# Patient Record
Sex: Male | Born: 1935 | Race: White | Hispanic: No | Marital: Married | Smoking: Former smoker
Health system: Southern US, Community
[De-identification: ages and names within clinical notes are randomized; demographics above are authoritative.]

## PROBLEM LIST (undated history)

## (undated) DIAGNOSIS — I2510 Atherosclerotic heart disease of native coronary artery without angina pectoris: Secondary | ICD-10-CM

## (undated) DIAGNOSIS — Z86718 Personal history of other venous thrombosis and embolism: Secondary | ICD-10-CM

## (undated) DIAGNOSIS — Z951 Presence of aortocoronary bypass graft: Secondary | ICD-10-CM

## (undated) DIAGNOSIS — C189 Malignant neoplasm of colon, unspecified: Secondary | ICD-10-CM

## (undated) DIAGNOSIS — Z9289 Personal history of other medical treatment: Secondary | ICD-10-CM

## (undated) DIAGNOSIS — Z86711 Personal history of pulmonary embolism: Secondary | ICD-10-CM

## (undated) DIAGNOSIS — E785 Hyperlipidemia, unspecified: Secondary | ICD-10-CM

## (undated) DIAGNOSIS — I4510 Unspecified right bundle-branch block: Secondary | ICD-10-CM

## (undated) DIAGNOSIS — I10 Essential (primary) hypertension: Secondary | ICD-10-CM

## (undated) HISTORY — DX: Personal history of pulmonary embolism: Z86.711

## (undated) HISTORY — DX: Unspecified right bundle-branch block: I45.10

## (undated) HISTORY — DX: Hyperlipidemia, unspecified: E78.5

## (undated) HISTORY — PX: COLONOSCOPY: SHX174

## (undated) HISTORY — DX: Presence of aortocoronary bypass graft: Z95.1

## (undated) HISTORY — PX: CARDIAC CATHETERIZATION: SHX172

## (undated) HISTORY — DX: Atherosclerotic heart disease of native coronary artery without angina pectoris: I25.10

## (undated) HISTORY — DX: Malignant neoplasm of colon, unspecified: C18.9

## (undated) HISTORY — DX: Personal history of other venous thrombosis and embolism: Z86.718

## (undated) HISTORY — DX: Personal history of other medical treatment: Z92.89

## (undated) HISTORY — DX: Essential (primary) hypertension: I10

---
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
## 1976-01-22 HISTORY — PX: ANKLE SURGERY: SHX546

## 1995-01-22 DIAGNOSIS — C189 Malignant neoplasm of colon, unspecified: Secondary | ICD-10-CM

## 1995-01-22 HISTORY — DX: Malignant neoplasm of colon, unspecified: C18.9

## 1997-03-21 ENCOUNTER — Ambulatory Visit (HOSPITAL_COMMUNITY): Admission: RE | Admit: 1997-03-21 | Discharge: 1997-03-21 | Payer: Self-pay | Admitting: *Deleted

## 1997-04-21 ENCOUNTER — Encounter: Admission: RE | Admit: 1997-04-21 | Discharge: 1997-07-20 | Payer: Self-pay | Admitting: *Deleted

## 1999-03-29 ENCOUNTER — Ambulatory Visit (HOSPITAL_COMMUNITY): Admission: RE | Admit: 1999-03-29 | Discharge: 1999-03-29 | Payer: Self-pay | Admitting: Gastroenterology

## 2001-01-21 HISTORY — PX: CHOLECYSTECTOMY: SHX55

## 2001-06-12 ENCOUNTER — Ambulatory Visit (HOSPITAL_COMMUNITY): Admission: RE | Admit: 2001-06-12 | Discharge: 2001-06-12 | Payer: Self-pay | Admitting: Gastroenterology

## 2001-06-12 ENCOUNTER — Encounter (INDEPENDENT_AMBULATORY_CARE_PROVIDER_SITE_OTHER): Payer: Self-pay | Admitting: *Deleted

## 2001-11-12 ENCOUNTER — Encounter: Payer: Self-pay | Admitting: Internal Medicine

## 2001-11-12 ENCOUNTER — Inpatient Hospital Stay (HOSPITAL_COMMUNITY): Admission: EM | Admit: 2001-11-12 | Discharge: 2001-11-22 | Payer: Self-pay | Admitting: Emergency Medicine

## 2001-11-13 ENCOUNTER — Encounter: Payer: Self-pay | Admitting: Internal Medicine

## 2001-11-17 ENCOUNTER — Encounter: Payer: Self-pay | Admitting: Internal Medicine

## 2001-11-18 ENCOUNTER — Encounter (INDEPENDENT_AMBULATORY_CARE_PROVIDER_SITE_OTHER): Payer: Self-pay | Admitting: Specialist

## 2001-12-22 ENCOUNTER — Encounter: Payer: Self-pay | Admitting: General Surgery

## 2001-12-22 ENCOUNTER — Encounter: Admission: RE | Admit: 2001-12-22 | Discharge: 2001-12-22 | Payer: Self-pay | Admitting: General Surgery

## 2001-12-27 ENCOUNTER — Inpatient Hospital Stay (HOSPITAL_COMMUNITY): Admission: EM | Admit: 2001-12-27 | Discharge: 2002-01-02 | Payer: Self-pay | Admitting: Emergency Medicine

## 2001-12-27 ENCOUNTER — Encounter: Payer: Self-pay | Admitting: Gastroenterology

## 2001-12-27 ENCOUNTER — Encounter: Payer: Self-pay | Admitting: Emergency Medicine

## 2001-12-30 ENCOUNTER — Encounter: Payer: Self-pay | Admitting: Gastroenterology

## 2002-11-23 ENCOUNTER — Ambulatory Visit (HOSPITAL_COMMUNITY): Admission: RE | Admit: 2002-11-23 | Discharge: 2002-11-23 | Payer: Self-pay | Admitting: Ophthalmology

## 2002-11-29 ENCOUNTER — Ambulatory Visit (HOSPITAL_COMMUNITY): Admission: RE | Admit: 2002-11-29 | Discharge: 2002-11-29 | Payer: Self-pay | Admitting: Ophthalmology

## 2003-04-08 ENCOUNTER — Emergency Department (HOSPITAL_COMMUNITY): Admission: EM | Admit: 2003-04-08 | Discharge: 2003-04-09 | Payer: Self-pay | Admitting: Emergency Medicine

## 2004-01-22 HISTORY — PX: HERNIA REPAIR: SHX51

## 2004-02-21 ENCOUNTER — Ambulatory Visit (HOSPITAL_COMMUNITY): Admission: RE | Admit: 2004-02-21 | Discharge: 2004-02-21 | Payer: Self-pay | Admitting: General Surgery

## 2004-02-21 ENCOUNTER — Encounter (INDEPENDENT_AMBULATORY_CARE_PROVIDER_SITE_OTHER): Payer: Self-pay | Admitting: *Deleted

## 2006-01-21 DIAGNOSIS — Z951 Presence of aortocoronary bypass graft: Secondary | ICD-10-CM

## 2006-01-21 HISTORY — DX: Presence of aortocoronary bypass graft: Z95.1

## 2006-02-06 ENCOUNTER — Inpatient Hospital Stay (HOSPITAL_COMMUNITY): Admission: RE | Admit: 2006-02-06 | Discharge: 2006-02-11 | Payer: Self-pay | Admitting: Cardiothoracic Surgery

## 2006-02-06 HISTORY — PX: CORONARY ARTERY BYPASS GRAFT: SHX141

## 2006-02-25 ENCOUNTER — Encounter: Admission: RE | Admit: 2006-02-25 | Discharge: 2006-02-25 | Payer: Self-pay | Admitting: Cardiothoracic Surgery

## 2006-02-28 ENCOUNTER — Ambulatory Visit: Payer: Self-pay | Admitting: Cardiothoracic Surgery

## 2006-10-19 ENCOUNTER — Inpatient Hospital Stay (HOSPITAL_COMMUNITY): Admission: EM | Admit: 2006-10-19 | Discharge: 2006-10-27 | Payer: Self-pay | Admitting: Emergency Medicine

## 2006-10-21 ENCOUNTER — Ambulatory Visit: Payer: Self-pay | Admitting: Surgery

## 2008-09-21 DIAGNOSIS — Z9289 Personal history of other medical treatment: Secondary | ICD-10-CM

## 2008-09-21 HISTORY — DX: Personal history of other medical treatment: Z92.89

## 2008-09-29 IMAGING — CR DG CHEST 1V PORT
1 series · 1 of 1 positions shown · non-contrast
Comparison: 02/06/06.

CLINICAL DATA: 80-year-old with coronary artery disease.
 PORTABLE CHEST - 1 VIEW:

[view not recorded]
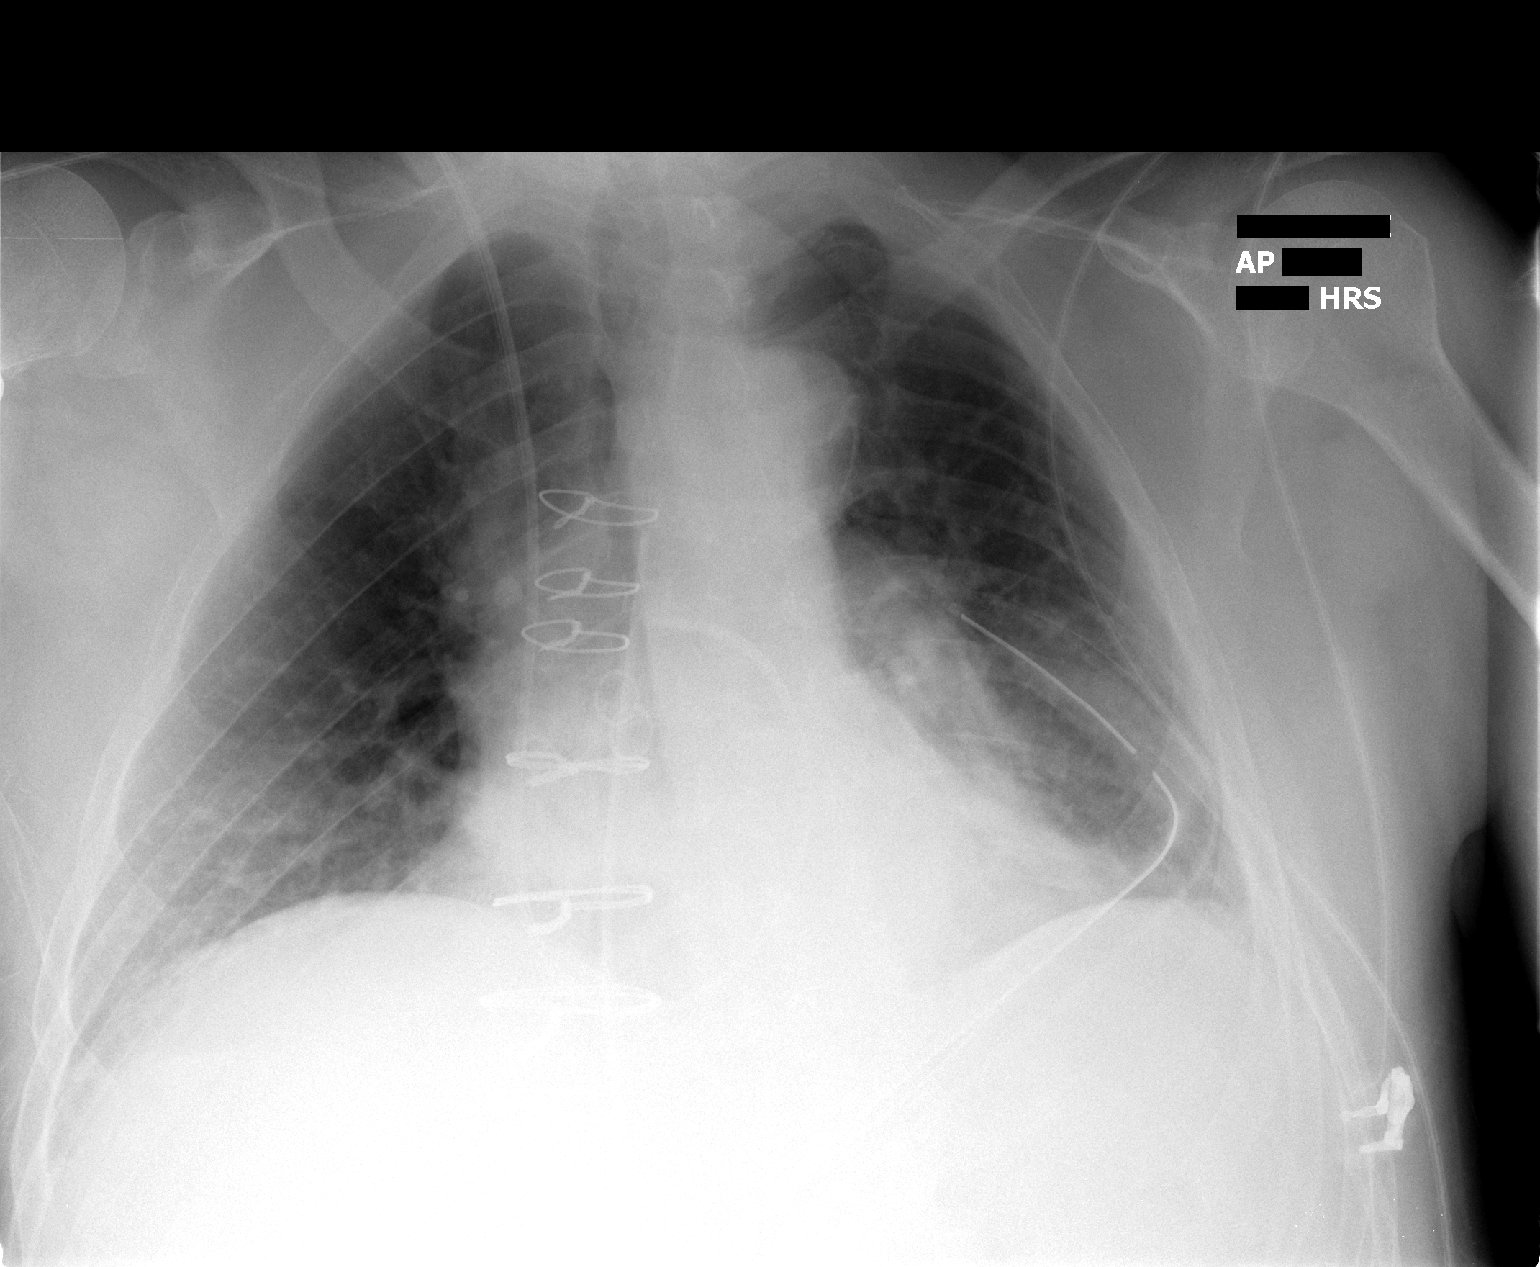

[1 of 1 positions shown; findings below may reference images not displayed]

FINDINGS: Portable semiupright view of the chest demonstrates removal of the nasogastric tube and endotracheal tube. One of the mediastinal drains appears to have been removed. The patient continues to have left chest tubes and a mediastinal drain. Pulmonary artery catheter is in the main right pulmonary artery. Decreased lung volumes with some atelectasis. The heart and mediastinum are stable status-post median sternotomy. No evidence for pneumothorax.
IMPRESSION: 1.  Removal of support apparatus as described.  No pneumothorax. 
 2.  Postoperative changes with low lung volumes.

## 2009-07-13 ENCOUNTER — Inpatient Hospital Stay (HOSPITAL_COMMUNITY): Admission: EM | Admit: 2009-07-13 | Discharge: 2009-07-17 | Payer: Self-pay | Admitting: Emergency Medicine

## 2010-02-10 ENCOUNTER — Encounter: Payer: Self-pay | Admitting: Family Medicine

## 2010-04-08 LAB — BASIC METABOLIC PANEL
BUN: 13 mg/dL (ref 6–23)
BUN: 22 mg/dL (ref 6–23)
CO2: 25 mEq/L (ref 19–32)
Calcium: 7.9 mg/dL — ABNORMAL LOW (ref 8.4–10.5)
Chloride: 106 mEq/L (ref 96–112)
Chloride: 109 mEq/L (ref 96–112)
Creatinine, Ser: 0.92 mg/dL (ref 0.4–1.5)
Creatinine, Ser: 1 mg/dL (ref 0.4–1.5)
GFR calc Af Amer: >60 mL/min (ref >60)
GFR calc Af Amer: >60 mL/min (ref >60)
GFR calc non Af Amer: >60 mL/min (ref >60)
GFR calc non Af Amer: >60 mL/min (ref >60)
Potassium: 3.5 mEq/L (ref 3.5–5.1)
Potassium: 3.8 mEq/L (ref 3.5–5.1)
Potassium: 3.9 mEq/L (ref 3.5–5.1)
Sodium: 139 mEq/L (ref 135–145)
Sodium: 139 mEq/L (ref 135–145)

## 2010-04-08 LAB — CBC
HCT: 34 % — ABNORMAL LOW (ref 39.0–52.0)
HCT: 37.6 % — ABNORMAL LOW (ref 39.0–52.0)
HCT: 40.8 % (ref 39.0–52.0)
Hemoglobin: 13.7 g/dL (ref 13.0–17.0)
MCH: 34.5 pg — ABNORMAL HIGH (ref 26.0–34.0)
MCV: 103 fL — ABNORMAL HIGH (ref 78.0–100.0)
MCV: 103.5 fL — ABNORMAL HIGH (ref 78.0–100.0)
Platelets: 225 10*3/uL (ref 150–400)
Platelets: 229 10*3/uL (ref 150–400)
Platelets: 232 10*3/uL (ref 150–400)
RBC: 3.63 MIL/uL — ABNORMAL LOW (ref 4.22–5.81)
RBC: 3.96 MIL/uL — ABNORMAL LOW (ref 4.22–5.81)
RDW: 14.3 % (ref 11.5–15.5)
RDW: 14.3 % (ref 11.5–15.5)
WBC: 11.3 10*3/uL — ABNORMAL HIGH (ref 4.0–10.5)
WBC: 11.9 10*3/uL — ABNORMAL HIGH (ref 4.0–10.5)
WBC: 8.6 10*3/uL (ref 4.0–10.5)

## 2010-04-08 LAB — HEPARIN LEVEL (UNFRACTIONATED)
Heparin Unfractionated: 0.18 IU/mL — ABNORMAL LOW (ref 0.30–0.70)
Heparin Unfractionated: 0.28 IU/mL — ABNORMAL LOW (ref 0.30–0.70)
Heparin Unfractionated: 0.65 IU/mL (ref 0.30–0.70)
Heparin Unfractionated: 0.78 IU/mL — ABNORMAL HIGH (ref 0.30–0.70)

## 2010-04-08 LAB — PROTIME-INR
INR: 1.98 — ABNORMAL HIGH (ref 0.00–1.49)
INR: 2.19 — ABNORMAL HIGH (ref 0.00–1.49)
INR: 2.35 — ABNORMAL HIGH (ref 0.00–1.49)
Prothrombin Time: 22.3 seconds — ABNORMAL HIGH (ref 11.6–15.2)
Prothrombin Time: 25.5 seconds — ABNORMAL HIGH (ref 11.6–15.2)

## 2010-04-08 LAB — DIFFERENTIAL
Basophils Absolute: 0.3 10*3/uL — ABNORMAL HIGH (ref 0.0–0.1)
Eosinophils Absolute: 0 10*3/uL (ref 0.0–0.7)
Eosinophils Relative: 0 % (ref 0–5)
Lymphocytes Relative: 16 % (ref 12–46)
Lymphs Abs: 2.2 10*3/uL (ref 0.7–4.0)
Monocytes Absolute: 0.8 10*3/uL (ref 0.1–1.0)
Monocytes Relative: 6 % (ref 3–12)
Neutrophils Relative %: 76 % (ref 43–77)

## 2010-04-08 LAB — COMPREHENSIVE METABOLIC PANEL
ALT: 19 U/L (ref 0–53)
Albumin: 4 g/dL (ref 3.5–5.2)
Alkaline Phosphatase: 55 U/L (ref 39–117)
BUN: 20 mg/dL (ref 6–23)
Total Bilirubin: 1.1 mg/dL (ref 0.3–1.2)

## 2010-04-08 LAB — URINALYSIS, ROUTINE W REFLEX MICROSCOPIC
Ketones, ur: NEGATIVE mg/dL
Protein, ur: NEGATIVE mg/dL
Specific Gravity, Urine: 1.024 (ref 1.005–1.030)

## 2010-04-08 LAB — POCT I-STAT, CHEM 8
Calcium, Ion: 1.19 mmol/L (ref 1.12–1.32)
Creatinine, Ser: 1.3 mg/dL (ref 0.4–1.5)
Glucose, Bld: 132 mg/dL — ABNORMAL HIGH (ref 70–99)
Sodium: 139 mEq/L (ref 135–145)
TCO2: 26 mmol/L (ref 0–100)

## 2010-04-08 LAB — LIPASE, BLOOD: Lipase: 33 U/L (ref 11–59)

## 2010-06-05 NOTE — Cardiovascular Report (Signed)
NAMEDABID, GODOWN NO.:  [id number]   MEDICAL RECORD NO.:  [id number]          PATIENT TYPE:  INP   LOCATION:  2041                         FACILITY:  [facility]   PHYSICIAN:  Nicki Guadalajara, M.D.     DATE OF BIRTH:  07/16/1925   DATE OF PROCEDURE:  10/20/2006  DATE OF DISCHARGE:                            CARDIAC CATHETERIZATION   INDICATIONS:  Mr. Richard Mendez is a 77 year old gentleman, who in December  2007 was found to have significant anterior ischemia on nuclear stress  testing, which led to cardiac catheterization.  Catheterization showed  severe coronary calcification with 95% distal left main stenosis, 60% to  70% diffuse proximal LAD stenosis prior to total occlusion after septal  perforating artery and a moderate-sized diagonal vessel.  The circumflex  had 60% and 50% stenoses.  The right coronary was diffusely calcified  with 60% narrowing in the mid-segment, 70% in the distal RCA after the  PDA, and prior to the PLA, and there was extensive collateralization to  the entire LAD system, up to the point of proximal occlusion, from the  PDA vessel, to the LAD.  On February 06, 2006, he underwent bypass  surgery with Dr. Donata Clay, with a LIMA to the LAD, vein to diagonal,  vein to the OM-2, and vein to the PDA.  He does have mixed  hyperlipidemia.  He had done well, but over the past month had noticed a  change in symptomatology, with exertional shortness of breath.  A  percent Myoview study done last week showed now showed entirely normal  perfusion in the LAD territory, following revascularization, but there  was the possibility of nontransmural inferior scar/mild ischemia in the  mid inferior to apical inferior lateral segment.  The patient was  scheduled for an outpatient cardiac catheterization today but apparently  over the weekend developed increasing episodes of exertional shortness  of breath, leading to his [facility].  He is now  referred for definitive diagnostic cardiac catheterization.   PROCEDURE:  After pre-medication with Valium 5 mg intravenous, the  patient was prepped and draped in the usual fashion.  His right femoral  artery was punctured anteriorly, and a 5-French sheath was inserted  without difficulty.  Diagnostic catheterization done utilizing 5-French  Judkins 4 left and right coronary catheters.  A right guide catheter was  used for selective angiography into the vein graft supplying the OM1 and  OM2 vessel.  A left bypass catheter was used for selective angiography  into the diagonal graft.  A right bypass catheter was used for selective  angiography into the vein graft supplying the PDA.  A LIMA catheter was  used for selective angiography into the left internal mammary artery.  Pigtail catheter was used for biplane cine left ventriculography.  Hemostasis was attained by direct manual pressure.  The patient  tolerated the procedure well.   HEMODYNAMIC DATA:  Central aortic pressure 100/57.  Left ventricular  pressure 100/9.   ANGIOGRAPHIC DATA:  There was extensive coronary calcification of the  left main LAD, circumflex and right coronary artery  system.   The left main coronary artery was now totally occluded, in its mid-  segment.  There was no antegrade flow to either the native LAD or  circumflex vessel.   The vein graft supplying the OM-2 vessel was widely patent.  There was  filling of the left circumflex, all the way up to it's point of total  proximal occlusion.  There is mild 40% circumflex narrowing, after a  small marginal branch.   The vein graft supplying the diagonal vessel was widely patent.  There  was filling of the LAD, proximally up to the point of the left main  occlusion.  As noted previously, the LAD was occluded, in a short  segment just after the diagonal takeoff.   The LIMA graft was widely patent and anastomosed into the mid-distal  LAD.  The distal LAD was  small caliber and reached the LV apex.   The native right coronary artery was diffusely calcified and had  proximal 30% narrowing, mid 70% to 80% narrowing, followed by diffuse  50% narrowing, before and after the crux.  In the PLA, there was 80%  stenosis, beyond the PDA takeoff.   The vein graft supplying the PDA was widely patent.  There was no distal  PDA disease beyond the graft.  There was good visualization up the  antegrade to the RCA, with filling of the PLA, but again as noted on the  native injection there was 80% PLA stenosis.  A sequential graft had not  been placed to the PD PLA system.  The PLA was small caliber and at most  was 2-0 vessel.   Biplane cine left ventriculography revealed preserved global  contractility, without focal segmental wall motion abnormality.  Ejection fraction at least 55%.   IMPRESSION:  1. Severe native coronary artery disease with severe diffuse coronary      calcification and total occlusion of the left main, as well as      diffuse native RCA disease with 30% proximal, 70% to 80% mid,      diffuse 50% stenoses before and after the crux, and 80% distal RCA      stenosis involving the PLA vessel.  2. Patent vein graft supplying the PDA branch of the right coronary      artery.  3. Patent vein graft supplying the OM-2 branch of the circumflex      vessel.  4. Patent vein graft supplying the diagonal vessel.  5. Patent LIMA graft supplying the mid-distal LAD.   RECOMMENDATIONS:  Medical therapy.  Mr. Richard Mendez recent nuclear studies  demonstrates now normal perfusion to the LAD territory, which is  markedly improved from his December 2007stress study.  He does show  slight progressive scar/ischemia in the distal apical inferior  inferolateral wall, most likely due to his of PLA lesion.  Increased  medical therapy will be recommended.           ______________________________  Nicki Guadalajara, M.D.     TK/MEDQ  D:  10/20/2006  T:   10/20/2006  Job:  [id number]   cc:   Nicki Guadalajara, M.D.  Kerin Perna, M.D.  Marjory Lies, M.D.

## 2010-06-05 NOTE — Discharge Summary (Signed)
Richard Mendez, Richard Mendez                  ACCOUNT NO.:  [id number]   MEDICAL RECORD NO.:  [id number]          PATIENT TYPE:  INP   LOCATION:  2041                         FACILITY:  [facility]   PHYSICIAN:  Cristy Hilts. Jacinto Halim, MD       DATE OF BIRTH:  1925-07-27   DATE OF ADMISSION:  10/19/2006  DATE OF DISCHARGE:  10/27/2006                               DISCHARGE SUMMARY   DISCHARGING PHYSICIAN:  Dr. Jacinto Halim.   DISCHARGE DIAGNOSES:  1. Pulmonary embolism with right lower extremity deep venous      thrombosis.  2. Highlighted sensitivity to INR - the patient is being discharged      with supratherapeutic INR and plan to monitor it as an outpatient      by daily PT/INR and adjusting the dose of Coumadin appropriately.  3. Known coronary artery disease with history of coronary artery      bypass graft, status post cath this admission with patent grafts.  4. Hypertension - medications adjusted.  5. Dyslipidemia - treated.  6. Status post colectomy for colon cancer.  7. 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  8. Status post cholecystectomy with history of postoperative      pancreatitis.   This is a 77 year old gentleman patient of Dr. Tresa Endo with previous  history of coronary artery disease status post a CABG, who presented to  the emergency room with worsening dyspnea and diaphoresis and dizziness.  He denied any chest pain, but complained of episode of presyncope,  probably 2 or 3 days prior to his presentation.  He was seen by Dr.  Tresa Endo on September 25th, and scheduled for the cath on September 29th to  address the progression of coronary disease with given symptoms; but  because of worsening of his clinical status, the patient was presented  to the emergency room.  Here on admission, we cycled his enzymes and  there were negative.  We also checked his D-dimer that revealed positive  result, 2.61.  In followup, the patient underwent nuclear pulmonary  perfusion  test that showed high probability of bilateral pulmonary  emboli.   He underwent catheterization on October 20, 2006 that showed patent  graft and severe native coronary disease.   During this admission, the patient had a syncopal episode which could be  secondary to pulmonary embolism versus orthostatic blood pressure drop.  He was up and going to the bathroom, and was found lying on the bathroom  floor.  For a short period of time he was unresponsive and his blood  pressure was checked and it was 88 systolic.  Diastolic blood pressure  was not measurable, but a few minutes later it was possible to assess  blood pressure which was 102/60.   Rapid response was initiated.  The patient quickly came around and upon  questioning  about this sensation and about this accident, the patient  said that he was having a bowel movement and the next thing he knows he  woke up on the floor.   After adjusting his medications,  his blood pressure still remained  borderline hypertensive.  The patient was started on Coumadin.  His  lower extremity ultrasound revealed right DVT in the posterior tibial  vein in the mid calf.  Left lower extremity did not have any evidence of  DVT or superficial thrombolysis.   We started the patient on Coumadin, but he was overly-sensitive to the  medication, and his INR increased from 1.3 on September 30th to 2.7 on  October 1st, and 5.6 on October 2.  The next blood work was done on the  same day, October 2nd, in the afternoon, and it revealed 5.5.  INR on  October 3rd was 9.4, on October 5th 4.7, and on day of discharge 4.8.  Coumadin was placed on hold since October 1st.   His other blood work showed elevated homocysteine level 22.7.  He had  BNP that showed sodium 139, potassium 4.8, chloride 107, CO2 25, glucose  108, BUN 26, creatinine 171.  He also had CBC showing white blood cells  8.3, hemoglobin 11.9, hematocrit 35.7, platelet count 403.  Liver  function  tests were normal.  PSA was 1.53.  Carcinogen antigen was 0.7,  and as I mentioned, all his cardiac enzymes negative x3.   DISCHARGE MEDICATIONS:  1. Tricor 145 mg  daily.  2. Altace 2.5 mg daily.  3. Aspirin 81 mg daily.  4. Toprol XL 25 mg daily.  5. Crestor 5 mg daily.  6. ICaps b.i.d.  7. Coumadin will be started as outpatient and the dose will be      adjusted, depending on INR value.   DISCHARGE DIET:  Low-salt, low-fat, low-cholesterol diet.  The patient  was instructed to increase activity slowly.   DISCHARGE FOLLOWUP:  The office will call him to schedule appointment  for Coumadin, and he would need his appointment for pro time and INR  checkup, and he will need to have it done daily for 7-10 days, and Dr.  Tresa Endo will see the patient in followup in a couple of weeks.      Raymon Mutton, P.A.      Cristy Hilts. Jacinto Halim, MD  Electronically Signed    MK/MEDQ  D:  10/27/2006  T:  10/27/2006  Job:  [id number]

## 2010-06-08 NOTE — Op Note (Signed)
NAME:  Richard Mendez, Richard Mendez                            ACCOUNT NO.:  [id number]   MEDICAL RECORD NO.:  [id number]                   PATIENT TYPE:  INP   LOCATION:  3036                                 FACILITY:  [facility]   PHYSICIAN:  Ollen Gross. Vernell Morgans, M.D.              DATE OF BIRTH:  May 31, 1925   DATE OF PROCEDURE:  11/25/2001  DATE OF DISCHARGE:  11/22/2001                                 OPERATIVE REPORT   PREOPERATIVE DIAGNOSES:  Cholecystitis.   POSTOPERATIVE DIAGNOSES:  Cholecystitis.   OPERATION PERFORMED:  Attempted laparoscopic and subsequent open  cholecystectomy   SURGEON:  Ollen Gross. Carolynne Edouard, M.D.   ASSISTANT:  1. [place]. Daphine Deutscher, M.D.  2. Sandria Bales. Ezzard Standing, M.D.   ANESTHESIA:  General endotracheal.   DESCRIPTION OF PROCEDURE:  After informed consent was obtained, the patient  was brought to the operating room and placed in supine position on the  operating table.  After adequate induction of general anesthesia, the  patient's abdomen was prepped with Betadine and draped in the usual sterile  manner.  The area around the umbilicus was infiltrated with 0.25% Marcaine  and a small incision was made with a 15 blade knife.  This incision was  carried down bluntly through the subcutaneous tissue using Kelly clamps and  army-navy retractors until the linea alba was identified.  The linea alba  was incised with a 15 blade knife and each side was grasped with Kocher  clamps and elevated anteriorly.  The preperitoneal space was probed bluntly  with a hemostat.  The patient had prior midline abdominal surgery before and  it appeared as though we could identify some omentum but there was no  obvious free space that could be identified.  At this point the laparoscopic  portion of the procedure was aborted.  The fascial defect was closed with a  Vicryl pursestring stitch.  Attention was then turned to the right upper  quadrant.  A subcostal incision was made with a 10 blade knife.  This  incision was carried down through the skin and subcutaneous tissues using  sharp dissection with the electrocautery until the fascia of the anterior  abdominal wall was encountered.  The anterior rectus fascia and external  oblique fascia were opened with the electrocautery.  The muscle was divided  sharply with the electrocautery and the posterior fascia was also divided  with the electrocautery.  The peritoneum was identified and opened sharply  with the Metzenbaum scissors.  The rest of the incision was then opened  under direct vision.  There was obvious adhesions of the omentum to the  anterior abdominal wall which were taken down in the area of the incision,  sharply with Metzenbaum scissors and the Bovie electrocautery.  There was an  obvious inflammatory reaction and phlegmon in the right upper quadrant.  This was mostly able to be finger fractured off of  the gallbladder without  any difficulty.  The gallbladder had some areas of gangrene.  The dome of  the gallbladder was grasped with a Kelly clamp.  Moist sponges and Deaver  retractors and sweetheart retractors were used to retract everything away  from the gallbladder.  The gallbladder was taken off the liver bed sharply.  At this point using the Bovie electrocautery and with some blunt dissection  with a tonsil clamp, the gallbladder seemed to be fairly friable and the  back wall of the gallbladder had to be left on the liver.  The cystic artery  was identified and controlled with vessel clips.  The gallbladder neck area  where it narrowed down to connect with the cystic duct could be palpated but  could not be dissected very well.  The gallbladder was divided near its base  at the gallbladder neck and removed from the patient.  The area thought to  be the gallbladder neck cystic duct junction was then ligated with 2-0  Vicryl stitch.  No actual patent cystic duct could be identified by probing.  There was too much inflammatory  reaction in this area to dissect it out any  further without significantly risking damaging the common duct.  The mucosa  from the back wall of the gallbladder was then fulgurated with the  electrocautery.  A 19 French round Blake drain was then brought into the  abdomen through a lateral puncture site lateral to the incision and brought  to rest in the bed of the liver where the gallbladder had been.  The wound  was irrigated with copious amounts of saline.  The stones that had spilled  from the gallbladder were all removed.  The posterior fascia of the  abdominal wall was then closed with a running #1 PDS suture.  The wound was  then irrigated and the anterior fascial layer was also closed with a running  #1 PDS suture.  The subcutaneous tissue was again irrigated.  The skin was  closed with staples.  The patient tolerated the procedure well.  At the end  of the case all sponge, needle and instrument counts were correct.  The  drain was anchored in place with a 3-0 nylon stitch and placed to bulb  suction.  The patient was awakened and taken to the recovery room in stable  condition.                                                   Ollen Gross. Vernell Morgans, M.D.    PST/MEDQ  D:  11/25/2001  T:  11/25/2001  Job:  [id number]

## 2010-06-08 NOTE — H&P (Signed)
NAME:  Richard Mendez, Richard Mendez                            ACCOUNT NO.:  [id number]   MEDICAL RECORD NO.:  [id number]                   PATIENT TYPE:  INP   LOCATION:  0477                                 FACILITY[facility]   PHYSICIAN:  Bernette Redbird, M.D.                DATE OF BIRTH:  29-Apr-1925   DATE OF ADMISSION:  12/26/2001  DATE OF DISCHARGE:                                HISTORY & PHYSICAL   HISTORY OF PRESENT ILLNESS:  This 77 year old male is being admitted to the  hospital because of acute pancreatitis.   The patient is approximately one month status post an open cholecystectomy  by Dr. Carolynne Edouard, at which time a fair amount of scar tissue was encountered as  well as gallbladder gangrene and inability to accomplish an intraoperative  cholangiogram for those reasons.  The patient had a JP drain in for a couple  of weeks with somewhat prolonged drainage, but it was finally pulled a  couple of weeks ago.   With that background, the patient had been doing reasonably well  postoperatively, apart from relative lethargy and inactivity, until this  afternoon around 12 noon when he developed severe epigastric pain radiating  through to the back which occurred after eating pizza for lunch.  He  presented to the emergency room where he was found to have elevation of both  lipase and liver chemistries.  He has received pain medication in the  emergency room and is feeling substantially better at this time.   ALLERGIES:  MORPHINE (rash).   OUTPATIENT MEDICATIONS:  Zocor (just started two days ago) and Flomax.   OPERATIONS:  1. Remote hip replacement.  2. Partial colectomy for colon cancer about six or seven years ago.  3. Remote splenectomy for ITP.  4. Open cholecystectomy about a month ago.   MEDICAL ILLNESSES:  1. Colon cancer (most recent colonoscopy by Dr. Randa Evens in May of this year     showed a polyp).  2. Previous history of ITP.  3. Hypercholesterolemia.  4. There is no known  coronary disease, COPD, diabetes, or hypertension.   HABITS:  Stopped smoking many years ago.  Moderate ethanol (a couple of  drinks a night) until his gallbladder surgery a month ago, and none since.   FAMILY HISTORY:  Possible colon cancer in a sister.  Also, gallstones in two  sisters.   SOCIAL HISTORY:  Married and is accompanied tonight by his wife and his  daughter, Idell Pickles, who are at the bedside.  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   REVIEW OF SYSTEMS:  The patient had a heavy sweat and a shaking chill this  evening but has not been running fevers as far as he is aware.  He has been  remarkably free of nausea, and there has been no vomiting.  He has had some  recent urinary incontinence  for which the Flomax was ordered.   PHYSICAL EXAMINATION:  VITAL SIGNS:  Temperature not recorded.  Blood  pressure 113/74, with pulse of 83, respirations 18.  GENERAL:  This is a well-nourished Caucasian gentleman in no acute distress,  having received Dilaudid with good relief in the emergency department.  He  is slightly somnolent but arouses very easily.  HEENT:  No definite scleral icterus.  No significant pallor.  Oropharynx  benign.  No cervical masses appreciated.  No obvious cervical adenopathy or  thyromegaly.  CHEST:  Slightly decreased breath sounds bilaterally with perhaps some E:A  changes and what sounded like some inspiratory wheezes at the right base,  although these cleared with deeper breathing.  HEART:  No gallops, rubs, murmurs, clicks, or arrhythmias.  ABDOMEN:  Status post multiple previous abdominal operations, but no overt  ventral hernia.  Active bowel sounds.  Mild subjective epigastric  tenderness.  No rebound or peritoneal findings. The abdominal exam is  remarkably benign.  RECTAL:  Not performed.  NEUROLOGIC:  The patient is coherent and appropriate and without obvious  cranial nerve deficits.   LABORATORY DATA:  White count  15,900, hemoglobin approximately 14.  Glucose  233, total bilirubin 40.  AST 368, ALT 344, lipase 2829.   CT scan (reviewed with radiologist by telephone):  Amazingly, the pancreas  looks normal.  No fluid collections around the pancreas or in the  gallbladder fossa.  No pleural effusions.  Some bibasilar atelectasis.  No  overt pancreatitis.  Certainly no phlegmon mention.   IMPRESSION:  1. The abrupt onset of upper abdominal pain radiating through to the back     with elevation of both liver chemistries and lipase would be strongly     suggestive of gallstone pancreatitis.  Zocor can cause pancreatitis, but     I think that is a less likely etiology since he has only had two half-     doses of that medication so far.  2. Glucose intolerance, presumably secondary to the acute pancreatitis.  3. History of colon cancer, up to date on colon cancer surveillance,     colonoscopies.  4. Remote history of idiopathic thrombocytopenic purpura, status post     previous splenectomy.   PLAN:  1. Supportive care with IV fluids and medications for pain and nausea while     observing for complications.  2. Follow laboratories.  3. Consider ERCP later today since his common duct has never been     visualized, even though it is possible that the stone which caused the     pancreatitis has passed.  It should be kept in mind that additional     stones could be present.  The major purpose and risks of ERCP and     possible sphincterotomy in this setting were reviewed with the patient     and his family.  4. Further management will depend on the patient's clinical evolution.                                               Bernette Redbird, M.D.    RB/MEDQ  D:  12/27/2001  T:  12/27/2001  Job:  [id number]   cc:   Molly Maduro L. Foy Guadalajara, M.D.  [address] [place]  [place] [address]  Fax: [phone]   Llana Aliment. Malon Kindle.,  M.D.  [address], [address]  [place]  [place] [address] Fax: [phone]    Ollen Gross. Vernell Morgans, M.D.  [address]  [place]  [place] [address]  Fax: [phone]

## 2010-06-08 NOTE — Op Note (Signed)
NAMEARTHURO, CANELO                  ACCOUNT NO.:  [id number]   MEDICAL RECORD NO.:  [id number]          PATIENT TYPE:  INP   LOCATION:  2303                         FACILITY:  [facility]   PHYSICIAN:  Kerin Perna, M.D.  DATE OF BIRTH:  01-01-1926   DATE OF PROCEDURE:  02/06/2006  DATE OF DISCHARGE:                               OPERATIVE REPORT   OPERATION:  Coronary artery bypass grafting x4 (left internal mammary  artery LAD, saphenous vein graft to diagonal, saphenous vein graft to OM-  2, saphenous vein graft to posterior descending).   PRE-AND-POSTOPERATIVE DIAGNOSIS:  Class IV unstable angina with left  main stenosis and 3-vessel coronary disease.   SURGEON:  Kerin Perna, M.D.   ASSISTANT:  Evelene Croon, MD and Rowe Clack, P.A.-C.   ANESTHESIA:  General.   INDICATIONS:  The patient is a 77 year old male with accelerating  exertional class III angina with some anginal episodes at rest.  Cardiac  catheterization by Dr. Daphene Jaeger demonstrated a high-grade left main  stenosis with occlusion of the LAD and reconstitution of the distal LAD  via collaterals.  There is also a significant right coronary stenosis.  LV function was fairly well-preserved with an EF of 45%.  He is felt to  be a candidate for surgical coronary revascularization.   Prior to surgery, I examined the patient in the office and reviewed the  results of the cardiac cath with the patient and family.  I discussed  the indications, and expected benefits of coronary bypass surgery for  treatment of his coronary disease.  I reviewed the alternatives to  surgical therapy as well.  I discussed with the patient and family the  major aspects of the planned procedure including:  The choice of conduit  to include internal mammary artery and endoscopically harvested  saphenous vein, the location of the surgical incisions, the use of  general anesthesia and cardiopulmonary bypass, and the expected  postoperative  hospital recovery.  I reviewed with the patient the risks  to him of coronary artery bypass surgery; including the risks of MI,  CVA, bleeding, blood transfusion requirement, infection, and death.  After reviewing these issues, he demonstrated his understanding and  agreement to proceed with operation; under what I felt was an informed  consent.   OPERATIVE FINDINGS:  The vein was harvested endoscopically from the left  leg.  The right leg had severe varicose veins.  The vein harvested from  the left upper leg was of adequate quality.  The mammary artery was a  good vessel with excellent flow.  The patient did not require any blood  transfusions for this operation.   PROCEDURE:  The patient was brought to operating room and placed supine  on the operating room table, general anesthesia was induced, under  invasive hemodynamic monitoring.  The chest, abdomen and legs were  prepped with Betadine and draped as a sterile field.  A sternal incision  was made as the saphenous vein was harvested endoscopically from the  left upper leg.  The left internal mammary artery was harvested  as a  pedicle graft from its origin at the subclavian vessels.  It was a good  vessel with excellent flow.  Heparin was administered and ACT was  documented as being therapeutic.  The sternal retractor was placed and  the pericardium opened and suspended.  Pursestrings were placed in the  ascending aorta and right atrium.  The patient was cannulated and placed  on bypass.  The coronaries were identified for grafting; and the mammary  artery and vein grafts were prepared for the distal anastomoses.  Cardioplegia catheters were placed for both antegrade aortic, and  retrograde coronary sinus cardioplegia.  The patient was cooled to 32  degrees.  Aortic crossclamp was applied; and 800 mL of cold blood  cardioplegia was delivered in split doses between the antegrade aortic,  and retrograde coronary sinus catheters.   There was good cardioplegic  arrest, and septal temperature dropped less than 12 degrees.   The distal coronary anastomoses were then performed.  The first distal  anastomosis was to the posterior descending branch of the right  coronary.  It had approximately 80% stenosis.  Reverse saphenous vein  was sewn end-to-side with running 7-0 Prolene.  There was good flow  through the graft.  The second distal anastomosis was the circumflex  marginal branch of the left coronary.  This had a proximal left main  stenosis of 95%, and a reversed saphenous vein was sewn end-to-side with  running 7-0 Prolene with good flow through the graft.  The third distal  anastomosis was to the diagonal branch of the LAD which had a proximal  95% stenosis.  A reverse saphenous vein was sewn end-to-side with  running 7-0 Prolene.  There was good flow through graft.  Cardioplegia  was redosed.  The fourth distal anastomosis was the distal third of the  LAD which had been occluded proximally.  The left IMA pedicle was  brought through an opening created in the left lateral pericardium.  The  mammary artery was brought down to the LAD which was a 1.5 mm vessel and  sewn end-to-side with a running 8-0 Prolene.  There was excellent flow  through the anastomosis after briefly releasing the pedicle bulldog on  the mammary artery.  The bulldog was reapplied and the pedicle secured  to the epicardium.  Cardioplegia was redosed.   While the crossclamp was still in place, three proximal vein anastomoses  were performed on the ascending aorta using a 4.0-mm punch and running 6-  0 Prolene.  Prior to tying down the final proximal anastomosis, air was  vented from the coronaries; and the left side of the heart using a dose  of retrograde warm blood cardioplegia.  The final proximal anastomosis  was tied; and the crossclamp was removed.  The heart resumed a spontaneous rhythm.  Air was aspirated from the vein  grafts with a  27-gauge needle and each was opened and had good flow.  The cardioplegic catheter was removed.  Hemostasis was documented to the  proximal and distal anastomoses.  The patient was rewarmed to 37  degrees.  Temporary pacing wires were applied.  The lungs were re-  expanded, the ventilator was resumed.  The patient was weaned from  bypass without difficulty on renal dose dopamine.  Blood pressure and  cardiac output were stable.  Protamine was administered without adverse  reaction.  The cannula was removed.  The mediastinum was irrigated with  warm antibiotic irrigation.  The leg incision was irrigated and  closed  in a standard fashion.  The superior pericardial fat was closed.  Two  mediastinal and a left pleural chest tube were placed and brought out  through separate incisions.  The sternum was closed with interrupted  steel wire.  The pectoralis fascia and subcutaneous layers were closed  with a running Vicryl.  The skin was closed with a subcuticular; and  sterile dressings were applied.  Total bypass time was 105 minutes with  crossclamp time of 72 minutes.      Kerin Perna, M.D.  Electronically Signed     PV/MEDQ  D:  02/06/2006  T:  02/06/2006  Job:  [id number]   cc:   [facility]  Nicki Guadalajara, M.D.

## 2010-06-08 NOTE — Op Note (Signed)
NAME:  Richard Mendez, Richard Mendez                  ACCOUNT NO.:  [id number]   MEDICAL RECORD NO.:  [id number]          PATIENT TYPE:  AMB   LOCATION:  DAY                          FACILITY:  [facility]   PHYSICIAN:  Timothy E. Earlene Plater, M.D. DATE OF BIRTH:  Jan 16, 1926   DATE OF PROCEDURE:  02/21/2004  DATE OF DISCHARGE:                                 OPERATIVE REPORT   PREOPERATIVE DIAGNOSIS:  Acute right inguinal hernia.   POSTOPERATIVE DIAGNOSIS:  Indirect inguinal hernia, right.   PROCEDURE:  Repair, indirect inguinal hernia, right, with mesh.   SURGEON:  Timothy E. Earlene Plater, M.D.   ANESTHESIA:  General.   [name] is otherwise healthy for 62, developed acute onset of a right  groin bulge and pain in the past week.  Though it has been reducible, it has  been a constant bother.  When seen in the office, he wishes to proceed  rapidly with repair, which has been carefully explained.  He was seen,  identified, the right inguinal area marked.  He was interviewed by  anesthesia.   He was taken to the operating room, placed supine, general endotracheal  anesthesia administered.  The right groin was shaved, prepped and draped in  the usual fashion.  A skin crease in the right groin was used for incision.  This was first injected with 0.25% Marcaine with epinephrine.  An incision  made, carried down through the abundant subcutaneous fatty tissue, the  external oblique fascia identified, opened in line with its fibers through  the external ring.  The cord structures appeared grossly normal but were  dissected from the floor of the canal and after careful dissection, a  moderate indirect hernia sac was identified and carefully dissected from the  cord structures.  This was a little bit of an unusual sac.  It was almost  necrotic and certainly fragile, but we were successful in completely  removing it from the cord structures down to the neck at the internal ring.  At this point it was suture ligated with 0  Prolene and the excess sac cut  away and submitted to pathology for their evaluation.  The neck of the sac  retracted through the internal ring.  A medium plug of mesh was placed in  the internal ring and sewn in place with 0 Prolene, and then a patch of mesh  was placed over the floor of the canal and 2-0 Prolene sutures were used to  tack it down to the floor of the canal.  The cord structures were placed in  their anatomic position.  Counts were correct.  The external oblique was  closed with a running 2-0 Vicryl, deep subcu 2-0 Vicryl, and the skin 3-0  Monocryl.  Steri-Strips were applied, the final count was correct.  He  tolerated it well and was awakened and taken to the recovery room in good  condition.   Written and verbal instructions were given, including Percocet for pain, and  he was then discharged home to be followed as an outpatient.      TED/MEDQ  D:  02/21/2004  T:  02/21/2004  Job:  [id number]   cc:   Fayrene Fearing L. Malon Kindle., M.D.  [address], [address]  [place]  [place] [address]  Fax: [phone]

## 2010-06-08 NOTE — Discharge Summary (Signed)
Richard Mendez, TAY NO.:  [id number]   MEDICAL RECORD NO.:  [id number]          PATIENT TYPE:  INP   LOCATION:  2031                         FACILITY:  [facility]   PHYSICIAN:  Kerin Perna, M.D.  DATE OF BIRTH:  1925-02-07   DATE OF ADMISSION:  02/06/2006  DATE OF DISCHARGE:                               DISCHARGE SUMMARY   HISTORY OF PRESENT ILLNESS:  The patient is a 77 year old white male  patient of Dr. Tresa Endo, who has had exertional chest tightness and  discomfort for the past few months.  He denies any resting or nocturnal  symptoms.  A stress test was done in late December which showed positive  ischemic changes.  Cardiac catheterization was performed, and this  revealed a 95% left main stenosis, 60% stenosis of the right coronary,  and 70% stenosis of the proximal LAD.  There was collateralization from  the right side to the left side vessels.  His ejection fraction was 55%.  Left ventricular and diastolic pressure was 17 mmHg.  There was no  evidence of aortic stenosis or mitral regurgitation.  Because of the  coronary anatomy, symptoms, and positive stress test, surgical  revascularization was recommended.  He was seen in consultation by Kerin Perna, M.D., who evaluated the patient and his studies, and agreed  with recommendations.  He was admitted this hospitalization for the  procedure.   PAST MEDICAL HISTORY:  1. Hyperlipidemia.  2. Varicose veins in the right leg.  3. History of colectomy for colon cancer in 1997.  4. Status post hip replacement.  5. Status post cholecystectomy, with a history of postoperative      pancreatitis.  6. Inguinal herniorrhaphy 2006.  7. 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   ALLERGIES:  Morphine causes a rash.   MEDICATIONS:  Prior to admission, Nexium 40 mg daily, Tricor 145 mg  daily, fish oil 2400 mg daily, calcium 600 mg b.i.d., doxazosin 4 mg  q.h.s., aspirin 325 mg daily, Toprol XL 25 mg daily,  Imdur 30 mg daily,  and garlic every morning.   FAMILY/SOCIAL HISTORY/ REVIEW OF SYSTEMS AND PHYSICAL EXAM:  Please see  the history and physical done at the time of admission.   HOSPITAL COURSE:  The patient was admitted electively.  On 02/06/2006,  he was taken to the operating room at which time he underwent the  following procedure.  Coronary artery bypass grafting times 4.  The  following grafts were placed:  1. Left internal mammary artery to the LAD.  2. Saphenous vein graft to the diagonal.  3. Saphenous vein graft to the obtuse marginal of #2.  4. Saphenous vein graft to the posterior descending.   The patient tolerated the procedure well, and was taken to the surgical  intensive care unit in stable condition.   POSTOPERATIVE HOSPITAL COURSE:  The patient has done quite well.  He has  remained hemodynamically stable.  All routine lines, monitors, and  drainage devices were discontinued in the standard fashion.  His  incisions are showing to be healing well  without evidence of infection.  His chest x-ray has been stable on appearance with routine postoperative  changes.  He has regarded moderate diuresis during the postoperative  period for volume overload.  He has remained neurologically intact.  His  oxygen has been weaned and he has maintained adequate saturations on  room air.  He has had no significant cardiac dysrhythmias or ectopy.  His laboratory values do reveal postoperative anemia, which is stable.  His most recent hemoglobin and hematocrit dated 02/09/2006 are 9.3 and  27.3 respectively.  Electrolytes, BUN, and creatinine are all within  normal limits.  His most recent creatinine is 1.4, also on 02/09/2006.  He has tolerated routine advancement activity commensurate to level of  postoperative convalescence using standard postop cardiac rehabilitation  phase one modalities.  His overall status is felt to be stable, for  tentative discharge in the morning of  02/11/2006, pending morning round  reevaluation.   MEDICATIONS AT DISCHARGE:  As follows:  Aspirin 325 mg daily, Toprol XL  25 mg daily, Tricor 145 mg daily, Cardura 4 mg daily at bedtime, Lasix  40 mg daily for 10 days, potassium chloride 20 meq daily for 10 days,  Nexium 40 mg daily.  He can resume his fish oil and resume his garlic.  For pain, Ultram 1-2 tablets every six hours as needed.   INSTRUCTIONS:  The patient received regular instructions regarding  medications, activities, diet, wound care, and follow up.  Follow up  includes Dr. Tresa Endo in 2 weeks post discharge.  Dr. Donata Clay will see  the patient on 02/28/2006 at 10:30 a.m.  Next chest x-ray will be  obtained at that time in follow up.   FINAL DIAGNOSIS:  Severe left main coronary artery disease and 3-vessel  coronary artery disease with class-3 angina, now status post surgical  revascularization as described.   OTHER DIAGNOSES:  Include postoperative anemia.  Hyperlipidemia.  History of varicose veins in the right leg.  History of colectomy for  colon cancer.  History of hip replacement.  History of cholecystectomy.  History of pancreatitis.  History of inguinal herniorrhaphy.  History of  remote splenectomy.      Rowe Clack, P.A.-C.      Kerin Perna, M.D.  Electronically Signed    WEG/MEDQ  D:  02/10/2006  T:  02/10/2006  Job:  [id number]   cc:   Kerin Perna, M.D.  Nicki Guadalajara, M.D.  Marjory Lies, M.D.

## 2010-06-08 NOTE — Discharge Summary (Signed)
NAME:  Richard Mendez, Richard Mendez                            ACCOUNT NO.:  [id number]   MEDICAL RECORD NO.:  [id number]                   PATIENT TYPE:  INP   LOCATION:  0477                                 FACILITY:  [facility]   PHYSICIAN:  James L. Malon Kindle., M.D.          DATE OF BIRTH:  11/17/1925   DATE OF ADMISSION:  12/27/2001  DATE OF DISCHARGE:  01/02/2002                                 DISCHARGE SUMMARY   ADMISSION DIAGNOSIS:  Acute pancreatitis.   FINAL DIAGNOSES:  1. Acute pancreatitis, resolved, etiology felt to be common duct stone with     possible drug-induced pancreatitis from Zocor.  2. History of remote hip replacement.  3. Open cholecystectomy one month ago.  4. Status post splenectomy for idiopathic thrombocytopenic purpura.  5. Colon cancer, status post partial colectomy with follow-up colonoscopies.   PROCEDURES:  1. ERCP.  2. Sphincterotomy with stone extraction.   HISTORY OF PRESENT ILLNESS:  The patient is a 77 year old gentleman who had  been well until a month ago when he underwent an open cholecystectomy by Dr.  Carolynne Edouard.  A fair amount of scar tissue was encountered from his previous  splenectomy.  He had gangrene of the gallbladder and, due to technical  reasons, intraoperative cholangiogram was not performed.  The patient had a  JP drain for several weeks with prolonged drainage, but it was finally  pulled.  He had been doing well postoperatively until the day of his  admission.  He had been eating food without any pain.  He had abrupt around  noon on the day of his admission of severe epigastric pain radiating through  to the back after eating several pieces of pizza for lunch and presented to  the emergency room.  In the emergency room his lipase was 2829, total  bilirubin 4, other liver tests up as well.  White count 15.8.  Initial CT  scan was normal.  He also had elevated glucose 233.   PHYSICAL EXAMINATION:  General:  Normal vital signs and blood  pressure.  Heart and Lung:  Were normal.  Abdomen:  Revealed multiple scars with mild epigastric tenderness.  For more details, please see the dictated admission history and physical.   HOSPITAL COURSE:  The patient was admitted to a medical floor and started on  IV fluids, kept n.p.o., given pain medications.  The patient underwent an  ERCP showing a normal common duct without clear stones  with empiric 10 mm  sphincterotomy was performed with balloon pull-through.  Following the  procedure the patient felt somewhat better but continued to have spiking  temperatures with temperatures up to 101 at night.  White count had gone up  to 19,000, although his liver tests improved.  This persisted for several  days.  I was concerned he may be developing cholangitis.  He was given Cipro  empirically.  We went ahead with a CT scan  which showed some pancreatitis in  the tail of the pancreas but no pancreatic necrosis or pseudocyst.  He was  started on clear liquids, fat-free, with protein-fortified supplements and  did better.  His temperature came down, we held his antibiotics and watched  him for 24 hours.  There was no further spike.  His white count was not  quite back to normal but continued to drop.  The patient did have elevated  blood sugars and was placed on a sliding scale, but these elevated blood  sugars were in the face of acute pancreatitis with IV D-5.  At times they  were normal.  Non fasting values were 130, 140 range with the patient taking  clear liquids.  It was not felt that he needed any specific treatment for  this right now but that this could be followed in the future.  At the time  of his discharge he had received dietary instructions and a low-fat diet,  was tolerating a no-fat diet without difficulty.   DISPOSITION:  The patient is sent home in much-improved condition.   DISCHARGE MEDICATIONS:  1. Flomax.  2. He will hold the Zocor indefinitely.   FOLLOW-UP:  We  will have him follow up with Dr. Randa Evens in a month and with  Dr. Marinda Elk in about a month as well to have his blood sugar rechecked  in more normal settings and to evaluate further for his cholesterol.  There  is still concern, given the temporal relationship of his pancreatitis to  beginning the Zocor that this could be a drug-induced pancreatitis from the  statin which are well known to cause this.   DISCHARGE INSTRUCTIONS:  He is instructed to call us if there are any  further problems.                                               James L. Malon Kindle., M.D.    Waldron Session  D:  01/02/2002  T:  01/02/2002  Job:  [id number]

## 2010-06-08 NOTE — Op Note (Signed)
NAME:  BEAUFORD, LANDO                  ACCOUNT NO.:  [id number]   MEDICAL RECORD NO.:  [id number]          PATIENT TYPE:  AMB   LOCATION:  DAY                          FACILITY:  [facility]   PHYSICIAN:  Timothy E. Earlene Plater, M.D. DATE OF BIRTH:  February 16, 1925   DATE OF PROCEDURE:  DATE OF DISCHARGE:                                 OPERATIVE REPORT   Audio too short to transcribe (less than 5 seconds)      TED/MEDQ  D:  02/21/2004  T:  02/21/2004  Job:  [id number]

## 2010-06-08 NOTE — Procedures (Signed)
[place]  Patient:    Richard, SCHELLENBERG Visit Number: [id number] MRN: [id number]          Service Type: END Location: ENDO Attending Physician:  Orland Mustard Dictated by:   Llana Aliment. Randa Evens, M.D. Proc. Date: 06/12/01 Admit Date:  06/12/2001 Discharge Date: 06/12/2001   CC:         [facility]  Timothy E. Earlene Plater, M.D.  Marinda Elk, M.D.   Procedure Report  PROCEDURE:  Colonoscopy.  MEDICATIONS:  Fentanyl 80 mcg, Versed 8 mg IV.  SCOPE:  Olympus adult video colonoscope.  INDICATION:  A 77 year old man who has had a Dukes B2 colon carcinoma resected, did not have adjuvant chemotherapy.  Had small polyps removed since his surgery.  This is done as a routine three-year follow-up.  DESCRIPTION OF PROCEDURE:  The procedure had been explained to the patient and consent obtained.  With the patient in the left lateral decubitus position, a digital exam was performed and the Olympus adult video colonoscope was inserted and advanced under direct visualization.  The prep was quite good. With the use of abdominal pressure and position change, we were able to reach the cecum.  The ileocecal valve and appendiceal orifice were seen.  The scope withdrawn and the cecum, ascending colon, hepatic flexure, transverse colon, splenic flexure, descending, and sigmoid colon were seen well upon removal. In the proximal descending colon a 0.5 cm sessile polyp was encountered, was snared and sucked through the scope.  No other polyps were seen.  Moderate diverticular disease in the sigmoid colon.  The scope withdrawn.  The rectum was free of polyps.  The patient tolerated the procedure well.  ASSESSMENT: 1. Descending colon polyp, removed. 2. No evidence of recurrent cancer.  PLAN:  Routine postpolypectomy instructions.  Will recommend repeating in three years. Dictated by:   Llana Aliment. Randa Evens, M.D. Attending Physician:  Orland Mustard DD:  06/12/01 TD:  06/15/01 Job: [id number]

## 2010-06-08 NOTE — Consult Note (Signed)
NAME:  Richard, Mendez                            ACCOUNT NO.:  [id number]   MEDICAL RECORD NO.:  [id number]                   PATIENT TYPE:  EMS   LOCATION:  ED                                   FACILITY:  [facility]   PHYSICIAN:  Angelia Mould. Derrell Lolling, M.D.             DATE OF BIRTH:  1925-08-25   DATE OF CONSULTATION:  04/09/2003  DATE OF DISCHARGE:  04/09/2003                                   CONSULTATION   REASON FOR CONSULTATION:  Abdominal pain.   HISTORY OF PRESENT ILLNESS:  This is a 77 year old white man, who was well  until 5 a.m.  He awoke with mild periumbilical pain.  The pain became  progressive with occasional spasms.  He had some mild nausea but never felt  like he was going to vomit.  He has been having normal bowel movements.  No  constipation or diarrhea recently.  He has not had any fever or chills.  He  has had no trouble voiding.   He saw Dr. Foy Guadalajara in the office, who felt that he had nonfocal abdominal  tenderness but noticed his white blood cell count was 20,000.  He asked for  my evaluation, and the patient was brought to the [facility] ER for  complete evaluation.   The patient states that he feels much better now, has no nausea, and states  that he wants to go home.   PAST HISTORY:  1. Arthritis.  Status post right total hip replacement in 1998.  He had     cortisone injection of his right shoulder yesterday.  2. Left ankle fracture in the past.  3. Pelvic fracture in the past.  4. Benign prostatic hypertrophy.  5. Colectomy for malignant polyp by Dr. Kendrick Ranch in 1997.  This sounds like     a sigmoid colectomy and possibly a T1 lesion.  6. 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  7. Open cholecystectomy for gangrenous cholecystitis, October 2003, by Dr.     Carolynne Edouard.  8. Readmission for acute pancreatitis in December 2003 by Dr. Carman Ching.     It sounds like he had ERCP.  9. History of cataracts.  10.      Last colonoscopy 2 years ago which was normal.  11.      He  denies history of hypertension, coronary artery disease, COPD,     stroke, or diabetes.   CURRENT MEDICATIONS:  1. Doxazosin.  2. Multivitamins.  3. Aspirin 81 mg daily.  4. I-CAPS.  5. Calcium.  6. Fish oil.  7. Garlic.   DRUG ALLERGIES:  MORPHINE causes skin rash and itching.   SOCIAL HISTORY:  The patient is married, lives in [place].  He has four  children.  He is retired from the [employer] business.   TOBACCO:  He quit smoking in 1974.   ALCOHOL:  He quit drinking 1 year ago after his  episode of pancreatitis.   FAMILY HISTORY:  Mother died, age 92, had peripheral vascular disease and  coronary artery disease.  Father died at age 56, had coronary artery  disease.  Five brothers; one died with a heart attack; one died from COPD;  one died from peritonitis.  Four sisters; one died of some type of  gastrointestinal cancer.   REVIEW OF SYSTEMS:  All systems are reviewed.  They are noncontributory  except as described above.   PHYSICAL EXAMINATION:  GENERAL:  Pleasant, older gentleman, who is alert and  in no distress whatsoever.  VITAL SIGNS:  Temperature 98.0, heart rate 88, respirations 20, blood  pressure 135/78.  EYES:  Sclerae clear.  Extraocular movements intact.  EARS/MOUTH/THROAT:  Nose and lips clean and oropharynx without gross  lesions.  He has upper and lower dentures.  NECK:  Supple, nontender, no jugular venous distension, no adenopathy or  thyroid mass.  LUNGS:  Clear to auscultation.  No chest wall tenderness.  HEART:  Regular rate and rhythm, no murmur.  ABDOMEN:  Slightly obese, soft, not distended, nontender.  He has hypoactive  bowel sounds.  He is not tympanitic.  There are multiple surgical scars in  the right subcostal region, in the midline, and left paramedian region, but  there is no hernia or palpable mass.  GENITOURINARY:  No inguinal adenopathy or hernia.  EXTREMITIES:  He moves all four extremities reasonably well and without pain   or deformity.  NEUROLOGIC:  No gross motor or sensory deficits.   DATA:  Lab work shows hemoglobin 16.2, white blood cell count of 18,500.  Urinalysis is normal.  Complete metabolic panel shows a glucose of 148 and,  otherwise, everything is within normal limits.  CT scan basically looks  good.  There is one slightly dilated loop of jejunum with a possible  transition suggesting possibly a partial small bowel obstruction.  Otherwise, there is no inflammatory change, no free fluid, no free air.   ASSESSMENT:  1. Abdominal pain which has resolved.  This is similar to episodes of pain     he has had in the past.  This may be due to adhesions and transient     partial small bowel obstruction which has now resolved.  2. Status post splenectomy for idiopathic thrombocytopenic purpura.  3. Status post cholecystectomy for gangrenous cholecystitis.  4. Status post acute pancreatitis of uncertain etiology.  5. Status post colectomy for malignant polyp.   PLAN:  I discussed my potential diagnosis with the patient and his wife.  I  gave him the option of admission to the hospital for observation or to go  home for observation on a clear liquid diet for 24 hours and then onto a  solid diet.   It was his strong desire to go home.  He was advised to stay on a liquid  diet for 24 hours and then if he felt well, to go onto solid food.  He was  advised to call me should the pain and nausea recur.  Otherwise, see me  p.r.n.                                               Angelia Mould. Derrell Lolling, M.D.    HMI/MEDQ  D:  04/09/2003  T:  04/10/2003  Job:  [id number]   cc:   Molly Maduro  Landry Dyke, M.D.  [address] [place]  [place] [address]  Fax: [phone]   Llana Aliment. Malon Kindle., M.D.  [address]  [place]  [place] [address]  Fax: [phone]

## 2010-06-08 NOTE — Consult Note (Signed)
NAME:  Richard Mendez, Richard Mendez                            ACCOUNT NO.:  [id number]   MEDICAL RECORD NO.:  [id number]                   PATIENT TYPE:  INP   LOCATION:  1823                                 FACILITY:  [facility]   PHYSICIAN:  Ollen Gross. Vernell Morgans, M.D.              DATE OF BIRTH:  05/06/1925   DATE OF CONSULTATION:  11/12/2001  DATE OF DISCHARGE:                                   CONSULTATION   REASON FOR CONSULTATION:  The patient is a 77 year old white male who  presents with some right-sided abdominal pain and nausea since Monday.  He  denies any fevers or chills at home.  He did induce vomiting once or twice.  He felt like he was taking some pills prescribed by his medical nurse  practitioner which dried him out and he has not had a bowel movement in the  last couple of days, which he attributes to being too dry.  He states that  the pain now is improved from what it was yesterday.  He denies any chest  pain.  He is having a little bit of shortness of breath.  No dysuria or  diarrhea.   PAST MEDICAL HISTORY:  His past medical history is significant for colon  cancer, ITP and thrombocytopenia and lower GI bleed.   PAST SURGICAL HISTORY:  Past surgical history is significant for partial  colectomy in 1997, a splenectomy many years ago.   MEDICATIONS:  Medications include aspirin and garlic pill.   ALLERGIES:  He has no known drug allergies.   SOCIAL HISTORY:  He quit smoking quite a few years ago and drinks about one  mixed drink most nights but not every night.   FAMILY HISTORY:  Family history is significant for pancreatic cancer in a  sister, coronary artery disease in his father and brother.   LABORATORY AND ACCESSORY CLINICAL DATA:  His lab work is significant for a  sodium of 129, potassium 4.2, chloride 99, CO2 26, creatinine of 0.8.  His  hemoglobin is 19, hematocrit is 55, his white count is pending.  His urine  is positive for some proteins and ketones and small  leukocytes.   PHYSICAL EXAMINATION:  VITAL SIGNS:  On physical exam, his temperature is  97.8, blood pressure is 130/78, pulse is 112.  GENERAL:  He is a somewhat obese, elderly white male in no acute distress.  SKIN:  His skin is warm and dry with no jaundice.  HEENT:  His extraocular muscles are intact.  Pupils are equal, round and  reactive to light.  LUNGS:  His lungs are clear.  HEART:  Heart reveals a regular rate and rhythm.  ABDOMEN:  Abdomen is a little bit distended but soft.  He has some mild-to-  moderate pain in his right upper quadrant but no guarding or peritoneal  signs.  EXTREMITIES:  No cyanosis, clubbing or edema.  NEUROLOGIC:  He is alert and oriented x3.  HEMATOLOGIC:  I could palpate no lymphadenopathy.   ASSESSMENT AND PLAN:  He underwent a CT scan tonight which showed some  inflammation, both around his gallbladder and his right colon.  There is a  question of air in his gallbladder.  We will plan to start him on some broad-  spectrum antibiotic coverage and obtain a right upper quadrant ultrasound to  further evaluate this area.  I suspect this would either be consistent with  a gangrenous gallbladder, either of cholecystitis or colitis that then  caused the adjacent structure to be inflamed; the area could also be  possibly caused by a fistula, which would also need to be repaired  operatively.  I spoke with the patient about these possibilities and he is  agreeable to proceed with the proposed plan as it has been set forth.  He is  not willing to agree to any kind of surgery at this point tonight.  We will  continue to follow him closely and hydrate him and correct any electrolyte  abnormalities and get him on the broad-spectrum antibiotics and discuss the  findings of the next studies with him once they are done.                                               Ollen Gross. Vernell Morgans, M.D.    PST/MEDQ  D:  11/12/2001  T:  11/13/2001  Job:  [id number]

## 2010-06-08 NOTE — Op Note (Signed)
NAME:  Richard Mendez, Richard Mendez                            ACCOUNT NO.:  [id number]   MEDICAL RECORD NO.:  [id number]                   PATIENT TYPE:  INP   LOCATION:  0477                                 FACILITY:  [facility]   PHYSICIAN:  Bernette Redbird, M.D.                DATE OF BIRTH:  May 01, 1925   DATE OF PROCEDURE:  12/27/2001  DATE OF DISCHARGE:                                 OPERATIVE REPORT   PROCEDURE:  Endoscopic retrograde cholangiopancreatography and  sphincterotomy with balloon pull through.   ENDOSCOPIST:  Bernette Redbird, M.D.   INDICATIONS:  A 77 year old gentleman who is approximately one month status  an open cholecystectomy.  At that time the patient had a somewhat gangrenous  gallbladder and a lot of scar tissue with inflammation making it impossible  technically to do an intraoperative cholangiogram.  Liver chemistries were  normal at the time.  The patient had somewhat prolonged JP drainage, but  that drained was pulled a couple of weeks ago.   The patient presented to the emergency room with apparent gallstone  pancreatitis characterized by a lipase level of 2800, a bilirubin of 4,  elevated transaminases, and epigastric pain going through to the back.  Overnight, the chemistries have improved moderately but have not normalized.   The only other issue is the fact the patient was started on Zocor two days  prior to the onset of pancreatitis and pancreatitis is a listed complication  of that medication.   FINDINGS:  No definite common duct stone but empiric sphincterotomy and  balloon pull through performed.   INFORMED CONSENT:  The nature, purpose and risks of ERCP in this setting  have carefully discussed with the patient along with the alternatives.  He  was agreeable to proceed and provided written consent.   DESCRIPTION OF PROCEDURE:  Rocephin 1 g IV was given as antibiotic  prophylaxis, most of which had infused prior to the procedure.  Fentanyl 50  mcg and  Versed 5 mg IV were used for sedation.  Glucagon 0.5 mg IV was given  to reduce duodenal contractility.   With the patient in the prone, oblique position, the Olympus duodenoscope  was passed blindly into the esophagus without undue difficulty and advanced  into a grossly normal-appearing stomach.   The patient seemed to have a somewhat enlarged stomach, and initially we had  difficulty traversing the pylorus with the scope, because the scope would  simply bow within the stomach.  We moved the patient into more of a left  lateral decubitus position and we were then successful albeit with some  difficulty in getting the scope to traverse the pylorus.   The major papilla was readily identified.  It had a normal appearance.  It  did not appear to have any obvious laceration or edema nor any tumor.   The wire-loaded triple lumen sphincterotome was used to probe the  capillary  orifice, and the wire was advanced before injection of any contrast.  On one  occasion, the wire advanced freely and we noted that we were in the  pancreatic duct, so the wire was withdrawn and the sphincterotome  repositioned.  Ultimately, we succeeded in advancing the wire up the common  duct, and we were able to achieve deep selective cannulation with the  sphincterotome.   Cholangiography did not show any obvious ductal abnormalities.  The duct was  probably at the upper limit of normal in size.  No strictures, stones, leaks  or filling defects were appreciated.  The distal portion of the duct was a  little bit difficult to visualize optimally.   At this time, I elected to perform a sphincterotomy since it was felt that a  nonvisualized-stone may get depressed.  The sphincterotomy was accomplished  using the Erbe coagulation device with no significant bleeding and a nice  smooth cut that I estimate measured approximately 10-12 mm.  There was a  flow of a small amount of clear amber-colored bile.   The  sphincterotome was removed, and the balloon tip catheter was advanced  over the guidewire in an exchange fashion.  The balloon sweep was made down  to the level of the papilla at which time the balloon was deflated.  No  stones, debris or fragments were identified as being removed during this  maneuver.  We then readvanced the balloon tip catheter and did a complete  balloon pull through.  The balloon came through the sphincterotome with just  mild to moderate resistance. Again, no stone fragments were delivered.  The  cholangiogram prior to the second balloon pull through showed what appeared  to be some air bubbles in the distal duct but again no definite stones were  seen.   The procedure was terminated at this point.   At one time, the patient's oxygen saturation went down to the mid to high  80s and resolved very promptly as his oxygen level was turned up.   The patient was never clinically unstable during the procedure.  He  tolerated it well, and there were no apparent complications.   IMPRESSION:  1. Normal-appearing papilla.  2. Pancreatic duct was cannulated with the guidewire but never injected with     contrast.  3. The common duct appeared normal.  It was perhaps at the upper limit of     normal size but was free of any evident filling defects, strictures or     leaks.  4. Empiric 10-12 mm sphincterotomy performed using the ERBE device.  Balloon     pull through accomplished without any stones being visualized as being     delivered.   PLAN:  1. We will watch for postprocedural complications and follow the patient up     clinically for resolution of his pancreatitis.  2. He should probably remain off Zocor indefinitely on the slight chance     that medication was responsible for his pancreatitis.                                               Bernette Redbird, M.D.    RB/MEDQ  D:  12/27/2001  T:  12/27/2001  Job:  [id number]  cc:   Molly Maduro L. Foy Guadalajara, M.D.  [address]  [place] [address]  Fax: [phone]   Ollen Gross. Vernell Morgans, M.D.  [address]  [place]  [place] [address]  Fax: [phone]   Llana Aliment. Malon Kindle., M.D.  [address]  [place]  [place] [address]  Fax: [phone]

## 2010-06-08 NOTE — Discharge Summary (Signed)
NAME:  Richard Mendez, Richard Mendez                            ACCOUNT NO.:  [id number]   MEDICAL RECORD NO.:  [id number]                   PATIENT TYPE:  INP   LOCATION:  3036                                 FACILITY:  [facility]   PHYSICIAN:  Jackie Plum, M.D.             DATE OF BIRTH:  09-29-1925   DATE OF ADMISSION:  11/12/2001  DATE OF DISCHARGE:  11/22/2001                                 DISCHARGE SUMMARY   DISCHARGE DIAGNOSES:  1. Cholecystitis status post cholecystectomy.  2. History of lower gastrointestinal bleed, status post colostomy.   MEDICATIONS AT DISCHARGE:  Cipro 500 mg p.o. b.i.d., Flagyl 500 mg p.o.  t.i.d., Vicodin for pain, aspirin every day.   ALLERGIES:  Morphine.   PROCEDURE:  The patient underwent open cholecystectomy performed by Dr. Carolynne Edouard  of general surgery.  The patient tolerated the procedure well and  recuperated without any complications.   HISTORY OF PRESENT ILLNESS:  This 77 year old white male with a history of  colon cancer status post colostomy in 1997 comes to the ED complaining of  constipation and abdominal pain x4 days.  Says he feels like gas is building  up in his abdomen.  He has had emesis x2.  Abdominal pain 8/10 without  radiation.  Did note decreased appetite with pain.  Has had some weight  loss, approximately 5 pounds in one week.  Last colonoscopy was in 2003 and  was normal according to the patient.  Was seen in primary's office the day  prior to admission and treated with Gas-X and Bentyl.  Denies fever, chills,  or urinary symptoms.  Denies alcohol use.  Acute abdominal series obtained  in the emergency department revealed dilated transverse colon and small  bowel loops with air fluid levels.  CT of the abdomen was obtained which  demonstrated a prominent right lower lobe atelectasis as well as  inflammatory changes surrounding the gallbladder and hepatic flexure region  of the colon, gallbladder wall thickening.  Several small  collections of gas  present within the gallbladder lumen.  Free abdominal fluid was noted about  the liver and along the right pericolic gutter.  No pneumoperitoneum.  Secondary to these findings the patient was admitted for acute  cholecystitis, and Dr. Carolynne Edouard was consulted.   HOSPITAL COURSE:  As noted the patient was admitted and provided with IV  hydration and pain control, underwent right upper quadrant ultrasound which  demonstrated multiple mobile gallstones.  Gallbladder wall being at the  upper limits of normal thickness.  No evidence of pericholecystic fluid.  Due to the patient's symptomatology and his radiographic findings, Dr. Carolynne Edouard  found him to be an acceptable surgical candidate.  The patient underwent  open cholecystectomy on November 18, 2001.  He tolerated the procedure well  and continued to recuperate without any complications.  IV antibiotics were  continued.  The patient was able to advance diet prior  to discharge without  nausea or vomiting.  The patient is discharged with JP drain intact with  some bilious drainage noted.  He is to follow up with Dr. Carolynne Edouard in the week  following his discharge.   DISCHARGE LABORATORIES:  White blood cell count 15.0, hemoglobin 12.9,  hematocrit 38.9, platelet count 803,000.  Coagulation studies were normal.  Sodium 134, potassium 3.6, chloride 107, CO2 of 24, glucose 173, BUN 5,  creatinine 1.1, calcium 8.3, total protein 5.5, albumin 2.2, AST 22, ALT 26,  ALP 60, total bilirubin 1.3.  Blood cultures x2 were negative.  CEA was 0.8.  Urinalysis was negative for UTI.   CONSULTATIONS:  Ollen Gross. Carolynne Edouard, M.D. of general surgery.   CONDITION ON DISCHARGE:  Substantially improved, stable, status post  cholecystectomy.   DISPOSITION:  Discharged to home with home health R.N. PT and OT.  Followup  is with Dr. Carolynne Edouard in the week following his discharge.      Ellender Hose. Christian Mate, M.D.    SMD/MEDQ  D:   01/15/2002  T:  01/16/2002  Job:  [id number]   cc:   Ollen Gross. Vernell Morgans, M.D.  [address]  [place]  [place] [address]  Fax: [phone]

## 2010-10-22 HISTORY — PX: TRANSTHORACIC ECHOCARDIOGRAM: SHX275

## 2010-10-22 HISTORY — PX: NM MYOCAR PERF WALL MOTION: HXRAD629

## 2010-11-01 LAB — BASIC METABOLIC PANEL
BUN: 18
BUN: 26 — ABNORMAL HIGH
CO2: 24
CO2: 25
CO2: 23
Calcium: 8.8
Calcium: 8.6
Chloride: 107
Creatinine, Ser: 1.52 — ABNORMAL HIGH
Creatinine, Ser: 1.71 — ABNORMAL HIGH
Creatinine, Ser: 1.49
GFR calc Af Amer: 54 — ABNORMAL LOW
Glucose, Bld: 108 — ABNORMAL HIGH
Glucose, Bld: 128 — ABNORMAL HIGH
Sodium: 139

## 2010-11-01 LAB — COMPREHENSIVE METABOLIC PANEL
ALT: 13
Albumin: 3.1 — ABNORMAL LOW
Alkaline Phosphatase: 33 — ABNORMAL LOW
Alkaline Phosphatase: 32 — ABNORMAL LOW
BUN: 23
BUN: 21
Chloride: 110
Chloride: 106
Glucose, Bld: 98
Glucose, Bld: 133 — ABNORMAL HIGH
Potassium: 4.4
Potassium: 4.5
Total Bilirubin: 0.8
Total Bilirubin: 0.6
Total Protein: 6.2

## 2010-11-01 LAB — DIFFERENTIAL
Basophils Absolute: 0.2 — ABNORMAL HIGH
Basophils Relative: 2 — ABNORMAL HIGH
Neutro Abs: 5.8
Neutrophils Relative %: 64

## 2010-11-01 LAB — CBC
HCT: 35.7 — ABNORMAL LOW
HCT: 35.7 — ABNORMAL LOW
Hemoglobin: 11.9 — ABNORMAL LOW
Hemoglobin: 10.9 — ABNORMAL LOW
Hemoglobin: 11.9 — ABNORMAL LOW
MCHC: 33.7
MCHC: 34
MCHC: 33.4
MCHC: 33.6
Platelets: 323
Platelets: 332
RBC: 3.39 — ABNORMAL LOW
RDW: 15.5 — ABNORMAL HIGH
RDW: 15.9 — ABNORMAL HIGH
RDW: 15.1 — ABNORMAL HIGH
RDW: 15.4 — ABNORMAL HIGH
WBC: 8.5

## 2010-11-01 LAB — PROTEIN S, TOTAL: Protein S Ag, Total: 81 % (ref 70–140)

## 2010-11-01 LAB — MAGNESIUM: Magnesium: 2.1

## 2010-11-01 LAB — PROTIME-INR
INR: 2.7 — ABNORMAL HIGH
INR: 1.3
Prothrombin Time: 52.2 — ABNORMAL HIGH
Prothrombin Time: 53.4 — ABNORMAL HIGH
Prothrombin Time: 57.1 — ABNORMAL HIGH
Prothrombin Time: 15.8 — ABNORMAL HIGH

## 2010-11-01 LAB — BETA-2-GLYCOPROTEIN I ABS, IGG/M/A
Beta-2 Glyco I IgG: 13 U/mL (ref <20)
Beta-2-Glycoprotein I IgA: 6 U/mL (ref <10)
Beta-2-Glycoprotein I IgM: <4 U/mL (ref <10)

## 2010-11-01 LAB — CK TOTAL AND CKMB (NOT AT ARMC)
CK, MB: 2.2
Relative Index: INVALID
Total CK: 49
Total CK: 50

## 2010-11-01 LAB — LUPUS ANTICOAGULANT PANEL
Lupus Anticoagulant: NOT DETECTED
PTTLA Confirmation: 4.7 (ref <8.0)
dRVVT Incubated 1:1 Mix: 39.4 (ref 36.1–47.0)

## 2010-11-01 LAB — PROTHROMBIN GENE MUTATION

## 2010-11-01 LAB — HOMOCYSTEINE: Homocysteine: 22.7 — ABNORMAL HIGH

## 2010-11-01 LAB — ANTITHROMBIN III: AntiThromb III Func: 70 — ABNORMAL LOW (ref 76–126)

## 2010-11-01 LAB — HEPATIC FUNCTION PANEL
Albumin: 2.8 — ABNORMAL LOW
Alkaline Phosphatase: 32 — ABNORMAL LOW
Total Protein: 5.6 — ABNORMAL LOW

## 2010-11-01 LAB — PROTEIN C, TOTAL: Protein C, Total: 29 % — ABNORMAL LOW (ref 70–140)

## 2010-11-01 LAB — D-DIMER, QUANTITATIVE: D-Dimer, Quant: 2.61 — ABNORMAL HIGH

## 2010-11-01 LAB — CEA: CEA: 0.7

## 2010-11-07 DIAGNOSIS — Z9289 Personal history of other medical treatment: Secondary | ICD-10-CM

## 2010-11-07 HISTORY — DX: Personal history of other medical treatment: Z92.89

## 2012-04-07 ENCOUNTER — Ambulatory Visit: Payer: Self-pay | Admitting: Cardiovascular Disease

## 2012-04-07 DIAGNOSIS — I2699 Other pulmonary embolism without acute cor pulmonale: Secondary | ICD-10-CM | POA: Insufficient documentation

## 2012-04-07 DIAGNOSIS — Z7901 Long term (current) use of anticoagulants: Secondary | ICD-10-CM | POA: Insufficient documentation

## 2012-04-07 DIAGNOSIS — I82409 Acute embolism and thrombosis of unspecified deep veins of unspecified lower extremity: Secondary | ICD-10-CM | POA: Insufficient documentation

## 2012-06-30 ENCOUNTER — Ambulatory Visit (INDEPENDENT_AMBULATORY_CARE_PROVIDER_SITE_OTHER): Payer: Medicare Other | Admitting: Pharmacist Clinician (PhC)/ Clinical Pharmacy Specialist

## 2012-06-30 ENCOUNTER — Ambulatory Visit: Payer: Self-pay | Admitting: Pharmacist Clinician (PhC)/ Clinical Pharmacy Specialist

## 2012-06-30 VITALS — BP 118/60 | HR 64

## 2012-06-30 DIAGNOSIS — I2699 Other pulmonary embolism without acute cor pulmonale: Secondary | ICD-10-CM

## 2012-06-30 DIAGNOSIS — I82409 Acute embolism and thrombosis of unspecified deep veins of unspecified lower extremity: Secondary | ICD-10-CM

## 2012-06-30 DIAGNOSIS — Z7901 Long term (current) use of anticoagulants: Secondary | ICD-10-CM

## 2012-08-11 ENCOUNTER — Ambulatory Visit: Payer: Medicare Other | Admitting: Pharmacist Clinician (PhC)/ Clinical Pharmacy Specialist

## 2012-08-18 ENCOUNTER — Ambulatory Visit (INDEPENDENT_AMBULATORY_CARE_PROVIDER_SITE_OTHER): Payer: Medicare Other | Admitting: Pharmacist Clinician (PhC)/ Clinical Pharmacy Specialist

## 2012-08-18 ENCOUNTER — Encounter: Payer: Self-pay | Admitting: Cardiovascular Disease

## 2012-08-18 ENCOUNTER — Ambulatory Visit (INDEPENDENT_AMBULATORY_CARE_PROVIDER_SITE_OTHER): Payer: Medicare Other | Admitting: Cardiovascular Disease

## 2012-08-18 ENCOUNTER — Ambulatory Visit: Payer: Medicare Other | Admitting: Pharmacist Clinician (PhC)/ Clinical Pharmacy Specialist

## 2012-08-18 VITALS — BP 118/70 | Ht 67.0 in | Wt 173.7 lb

## 2012-08-18 DIAGNOSIS — I10 Essential (primary) hypertension: Secondary | ICD-10-CM

## 2012-08-18 DIAGNOSIS — I2510 Atherosclerotic heart disease of native coronary artery without angina pectoris: Secondary | ICD-10-CM

## 2012-08-18 DIAGNOSIS — Z7901 Long term (current) use of anticoagulants: Secondary | ICD-10-CM

## 2012-08-18 DIAGNOSIS — I82409 Acute embolism and thrombosis of unspecified deep veins of unspecified lower extremity: Secondary | ICD-10-CM

## 2012-08-18 DIAGNOSIS — E785 Hyperlipidemia, unspecified: Secondary | ICD-10-CM | POA: Insufficient documentation

## 2012-08-18 DIAGNOSIS — I4510 Unspecified right bundle-branch block: Secondary | ICD-10-CM

## 2012-08-18 DIAGNOSIS — I2699 Other pulmonary embolism without acute cor pulmonale: Secondary | ICD-10-CM

## 2012-08-18 NOTE — Patient Instructions (Addendum)
Your physician recommends that you schedule a follow-up appointment in: 6 months  

## 2012-08-18 NOTE — Progress Notes (Signed)
Patient ID: Richard Mendez, male   DOB: 1925/08/19, 79 y.o.   MRN: [id number]     HPI: Richard Mendez, is a 79 y.o. male who presents to the office for six-month cardiology evaluation. Richard Mendez underwent CABG surgery on 02/06/2006 by Dr. Zenaida Niece trite and had a LIMA placed to the LAD, a vein to the diagonal, vein to the OM 2, and vein to the PDA. His last cardiac catheterization September 2008 showed that all grafts were patent. He did have left main occlusion. He was an 80% RCA stenosis involving the PLA vessel. He has a history of a right DVT with subsequent PE and is a been on Coumadin anticoagulation. He has a history of right bundle branch block, hypertension, as well as hyperlipidemia.  Over the past 6 months, he has continued to be stable. He denies symptoms of chest pain. He denies tachycardia palpitations. He denies increasing shortness of breath.  Past Medical History  Diagnosis Date  . Hypertension   . Hyperlipidemia   . RBBB (right bundle branch block)   . CAD (coronary artery disease)     CABG  . Hx of echocardiogram 11/07/2010    showed normal systolic function, he did have grade 2 diastolic dysfunction and also had abnormal tissue doppler suggestion increase LA pressure, left atrium was mildly dilated , he had mild moderate mitral annular calcificationb with mild MR. He had moderate tricuspid regurgitation with moderate pulmonary hypertension with estimated RV systolic pressure of 41. He had trace AR and trace PR.  [name] History of stress test 09/2008    showed no change from previous, no significant ischemia, and EF of 68%    Past Surgical History  Procedure Laterality Date  . Coronary artery bypass graft  02/06/2006    By Dr Donata Clay ha a LIMA placed to the LAD, a vein to the diagonal, a vein to the OM2 and a vein to the PDA.  . Cardiac catheterization  09/2006    Showed patent grafts.He dis have a distal 80% RCA stenosis involving the PLA vessel.   . Colonoscopy      Not on  File  Current Outpatient Prescriptions  Medication Sig Dispense Refill  . aspirin 81 MG tablet Take 81 mg by mouth daily.      . Calcium Carbonate-Vitamin D (CALCIUM + D PO) Take 1 tablet by mouth 2 (two) times daily.      . CRESTOR 10 MG tablet Take 1 tablet by mouth daily.      . finasteride (PROSCAR) 5 MG tablet Take 1 tablet by mouth daily.      . metoprolol succinate (TOPROL-XL) 50 MG 24 hr tablet Take 0.5 tablets by mouth daily.      . Multiple Vitamin (MULTIVITAMIN WITH MINERALS) TABS Take 1 tablet by mouth daily.      . Polyethylene Glycol 3350 (MIRALAX PO) Take by mouth. 1 cup full in the AM      . tamsulosin (FLOMAX) 0.4 MG CAPS Take 1 capsule by mouth daily.      [name] warfarin (COUMADIN) 1 MG tablet 2 mg daily except 1 mg wed and sat       No current facility-administered medications for this visit.    Socially he is married has 4 children 3 grandchildren 4 great-grandchildren. There is no tobacco or alcohol use. He does not routinely exercise.  ROS is negative for fevers, chills or night sweats. He denies visual changes. He denies PND or orthopnea. He denies  presyncope. There is no wheezing. He denies chest pain. He denies abdominal pain. He notes occasional indigestion. His has occasional trace ankle edema. No tremors. No rashes Other system review is negative.  PE BP 118/70  Ht 5\' 7"  (1.702 m)  Wt 173 lb 11.2 oz (78.79 kg)  BMI 27.2 kg/m2  General: Alert, oriented, no distress.  Skin: normal turgor, no rashes HEENT: Normocephalic, atraumatic. Pupils round and reactive; sclera anicteric;no lid lag.  Nose without nasal septal hypertrophy Mouth/Parynx benign; Mallinpatti scale 2/3 Neck: No JVD, no carotid briuts Lungs: clear to ausculatation and percussion; no wheezing or rales Heart: RRR, s1 s2 normal 1/6 sem Abdomen: soft, nontender; no hepatosplenomehaly, BS+; abdominal aorta nontender and not dilated by palpation. Pulses 2+ Extremities: no clubbing cyanosis or edema,  Homan's sign negative  Neurologic: grossly nonfocal  ECG: Normal sinus rhythm at 59 beats per minute with first-degree AV block. Right bundle branch block with repolarization changes.  LABS:  BMET    Component Value Date/Time   NA 138 07/17/2009 0404   K 3.8 07/17/2009 0404   CL 108 07/17/2009 0404   CO2 23 07/17/2009 0404   GLUCOSE 97 07/17/2009 0404   BUN 13 07/17/2009 0404   CREATININE 0.92 07/17/2009 0404   CALCIUM 7.9* 07/17/2009 0404   GFRNONAA >60 07/17/2009 0404   GFRAA  Value: >60        The eGFR has been calculated using the MDRD equation. This calculation has not been validated in all clinical situations. eGFR's persistently <60 mL/min signify possible Chronic Kidney Disease. 07/17/2009 0404     Hepatic Function Panel     Component Value Date/Time   PROT 7.6 07/13/2009 1131   ALBUMIN 4.0 07/13/2009 1131   AST 28 07/13/2009 1131   ALT 19 07/13/2009 1131   ALKPHOS 55 07/13/2009 1131   BILITOT 1.1 07/13/2009 1131   BILIDIR 0.1 10/23/2006 0545   IBILI 0.5 10/23/2006 0545     CBC    Component Value Date/Time   WBC 8.6 07/17/2009 0404   RBC 3.35* 07/17/2009 0404   HGB 11.7* 07/17/2009 0404   HCT 34.7* 07/17/2009 0404   PLT 232 07/17/2009 0404   MCV 103.6* 07/17/2009 0404   MCH 34.8* 07/17/2009 0404   MCHC 33.6 07/17/2009 0404   RDW 14.3 07/17/2009 0404   LYMPHSABS 2.2 07/13/2009 1131   MONOABS 0.8 07/13/2009 1131   EOSABS 0.0 07/13/2009 1131   BASOSABS 0.3* 07/13/2009 1131     BNP    Component Value Date/Time   PROBNP 943.0* 10/19/2006 1617    Lipid Panel  No results found for this basename: chol, trig, hdl, cholhdl, vldl, ldlcalc     RADIOLOGY: No results found.    ASSESSMENT AND PLAN: Richard Mendez continues to do fairly well and is now 6 1/2 years since his CABG revascularization surgery. At catheterization in 2008 all grafts were patent. His INR today is therapeutic at 2.7. He is on chronic Coumadin for his history of PE. He is not having significant symptoms of chest  pain or shortness of breath. He does have evidence for  mild to moderate pulmonary hypertension on his last echo in 2012. In January cholesterol was 143 triglycerides 139 LDL 82. Although he did have low HDL level of 33. I recommended he continue his current therapy progressing in 6 months for followup evaluation.     Lennette Bihari, MD, [facility]  08/18/2012 6:29 PM

## 2012-09-29 ENCOUNTER — Ambulatory Visit (INDEPENDENT_AMBULATORY_CARE_PROVIDER_SITE_OTHER): Payer: Medicare Other | Admitting: Pharmacist Clinician (PhC)/ Clinical Pharmacy Specialist

## 2012-09-29 DIAGNOSIS — I2699 Other pulmonary embolism without acute cor pulmonale: Secondary | ICD-10-CM

## 2012-09-29 DIAGNOSIS — I82409 Acute embolism and thrombosis of unspecified deep veins of unspecified lower extremity: Secondary | ICD-10-CM

## 2012-09-29 DIAGNOSIS — Z7901 Long term (current) use of anticoagulants: Secondary | ICD-10-CM

## 2012-10-08 ENCOUNTER — Other Ambulatory Visit: Payer: Self-pay | Admitting: Cardiovascular Disease

## 2012-10-09 NOTE — Telephone Encounter (Signed)
Rx was sent to pharmacy electronically. 

## 2012-11-10 ENCOUNTER — Ambulatory Visit (INDEPENDENT_AMBULATORY_CARE_PROVIDER_SITE_OTHER): Payer: Medicare Other | Admitting: Pharmacist Clinician (PhC)/ Clinical Pharmacy Specialist

## 2012-11-10 VITALS — BP 120/70

## 2012-11-10 DIAGNOSIS — I82409 Acute embolism and thrombosis of unspecified deep veins of unspecified lower extremity: Secondary | ICD-10-CM

## 2012-11-10 DIAGNOSIS — Z7901 Long term (current) use of anticoagulants: Secondary | ICD-10-CM

## 2012-11-10 DIAGNOSIS — I2699 Other pulmonary embolism without acute cor pulmonale: Secondary | ICD-10-CM

## 2012-11-10 LAB — POCT INR: INR: 2.8

## 2012-11-23 ENCOUNTER — Other Ambulatory Visit: Payer: Self-pay | Admitting: Cardiovascular Disease

## 2012-12-22 ENCOUNTER — Ambulatory Visit (INDEPENDENT_AMBULATORY_CARE_PROVIDER_SITE_OTHER): Payer: Medicare Other | Admitting: Pharmacist Clinician (PhC)/ Clinical Pharmacy Specialist

## 2012-12-22 VITALS — BP 126/60 | HR 56

## 2012-12-22 DIAGNOSIS — I2699 Other pulmonary embolism without acute cor pulmonale: Secondary | ICD-10-CM

## 2012-12-22 DIAGNOSIS — I82409 Acute embolism and thrombosis of unspecified deep veins of unspecified lower extremity: Secondary | ICD-10-CM

## 2012-12-22 DIAGNOSIS — Z7901 Long term (current) use of anticoagulants: Secondary | ICD-10-CM

## 2013-02-02 ENCOUNTER — Ambulatory Visit: Payer: Medicare Other | Admitting: Pharmacist Clinician (PhC)/ Clinical Pharmacy Specialist

## 2013-02-11 ENCOUNTER — Encounter: Payer: Self-pay | Admitting: *Deleted

## 2013-02-12 ENCOUNTER — Encounter: Payer: Self-pay | Admitting: Cardiovascular Disease

## 2013-02-15 ENCOUNTER — Ambulatory Visit (INDEPENDENT_AMBULATORY_CARE_PROVIDER_SITE_OTHER): Payer: Medicare Other | Admitting: Cardiovascular Disease

## 2013-02-15 ENCOUNTER — Ambulatory Visit (INDEPENDENT_AMBULATORY_CARE_PROVIDER_SITE_OTHER): Payer: Medicare Other | Admitting: Pharmacist Clinician (PhC)/ Clinical Pharmacy Specialist

## 2013-02-15 ENCOUNTER — Encounter: Payer: Self-pay | Admitting: Cardiovascular Disease

## 2013-02-15 VITALS — BP 100/70 | HR 61 | Ht 67.0 in | Wt 176.4 lb

## 2013-02-15 DIAGNOSIS — E782 Mixed hyperlipidemia: Secondary | ICD-10-CM

## 2013-02-15 DIAGNOSIS — Z7901 Long term (current) use of anticoagulants: Secondary | ICD-10-CM

## 2013-02-15 DIAGNOSIS — I2699 Other pulmonary embolism without acute cor pulmonale: Secondary | ICD-10-CM

## 2013-02-15 DIAGNOSIS — I4510 Unspecified right bundle-branch block: Secondary | ICD-10-CM

## 2013-02-15 DIAGNOSIS — I10 Essential (primary) hypertension: Secondary | ICD-10-CM

## 2013-02-15 DIAGNOSIS — I2510 Atherosclerotic heart disease of native coronary artery without angina pectoris: Secondary | ICD-10-CM

## 2013-02-15 DIAGNOSIS — R5383 Other fatigue: Secondary | ICD-10-CM

## 2013-02-15 DIAGNOSIS — Z79899 Other long term (current) drug therapy: Secondary | ICD-10-CM

## 2013-02-15 DIAGNOSIS — R5381 Other malaise: Secondary | ICD-10-CM

## 2013-02-15 DIAGNOSIS — I82409 Acute embolism and thrombosis of unspecified deep veins of unspecified lower extremity: Secondary | ICD-10-CM

## 2013-02-15 DIAGNOSIS — E785 Hyperlipidemia, unspecified: Secondary | ICD-10-CM

## 2013-02-15 LAB — POCT INR: INR: 2

## 2013-02-15 NOTE — Patient Instructions (Signed)
Your physician recommends that you return for lab work in fasting. Do not eat or drink after midnight the night prior to going to the lab.  Your physician recommends that you schedule a follow-up appointment in: 6 MONTHS.

## 2013-02-15 NOTE — Progress Notes (Signed)
Patient ID: Richard Mendez, male   DOB: 05-18-1925, 80 y.o.   MRN: [id number]      HPI: Richard Mendez, is a 80 y.o. male who presents to the office for six-month cardiology evaluation. Richard Mendez underwent CABG surgery on 02/06/2006 by Dr. Nils Pyle with a LIMA placed to the LAD, a vein to the diagonal, vein to the OM 2, and vein to the PDA. His last cardiac catheterization September 2008 showed that all grafts were patent. He did have left main occlusion. He was an 80% RCA stenosis involving the PLA vessel. He has a history of a right DVT with subsequent PE and is a been on Coumadin anticoagulation.  Additional problems include a history of right bundle branch block, hypertension, as well as hyperlipidemia. He does admit to occasional hip discomfort related to his prior hip replacement surgery in 1997.  Over the past 6 months, he has continued to be stable. He denies symptoms of exertional chest pain. At times he has noticed a vague chest discomfort in the left chest wall which he feels it improves with belching and is not exertionally related. He denies tachycardia palpitations. He denies increasing shortness of breath. He denies significant bleeding on Coumadin anticoagulation.  Past Medical History  Diagnosis Date  . Hypertension   . Hyperlipidemia   . RBBB (right bundle branch block)   . CAD (coronary artery disease)     CABG  . Hx of echocardiogram 11/07/2010    showed normal systolic function, grade 2 diastolic dysfunction and also had abnormal tissue doppler suggesting increased LA pressure, left atrium was mildly dilated, mild to moderate mitral annular calcification with mild MR, moderate tricuspid regurgitation with moderate pulmonary hypertension with estimated RV systolic pressure of 41; trace AR and trace PR  . History of stress test 09/2008    no change from previous, no significant ischemia, and EF of 68%  . S/P CABG (coronary artery bypass graft) 01/2006  . History of DVT (deep vein  thrombosis)     right  . History of pulmonary embolism   . Colon cancer 1997    surgery     Past Surgical History  Procedure Laterality Date  . Coronary artery bypass graft  02/06/2006    LIMA placed to the LAD, a vein to the diagonal, a vein to the OM2 and a vein to the PDA. ([name]) - diagnostic cath by Dr. Corky Downs on 01/31/2006  . Cardiac catheterization  9/292008    severe native CAD with severe diffuse coronary calcif & total occlusion of L main & diffuse native RCA with 30% prox, 70-80% mid, diffused 50% stenosis before & after the crux, 80% distal RCA stenosis involving PLA; patent vein graft to PDA of RCA, patent vein graft to OM2 of Cfx, patient vein graft supplying diagonal, patent LIMA to mid-distal LAD (Dr. Corky Downs)  . Colonoscopy    . Cholecystectomy  2003  . Hernia repair  2006  . Splenectomy  1944  . Ankle surgery  1978    ankle fracture  . Transthoracic echocardiogram  10/2010    EF=>55%, LA mildly dilated, RA mildly dilated; mild-mod mitral annular calcif, miild MR, mod TR, mod pulm HTN, RSVP 40-50mH; trace AV regurg, trace pulm valve regurg  . Nm myocar perf wall motion  10/2010    lexiscan; normal patten of perfusion in all regions; post-stress EF 70%, occ PVCs throughout the study, low risk scan     Allergies  Allergen  Reactions  . Morphine And Related Itching and Rash    Current Outpatient Prescriptions  Medication Sig Dispense Refill  . aspirin 81 MG tablet Take 81 mg by mouth daily.      . Calcium Carbonate-Vitamin D (CALCIUM + D PO) Take 1 tablet by mouth 2 (two) times daily.      . CRESTOR 10 MG tablet TAKE 1 TABLET BY MOUTH EVERY NIGHT AT BEDTIME  30 tablet  11  . finasteride (PROSCAR) 5 MG tablet Take 1 tablet by mouth daily.      . metoprolol succinate (TOPROL-XL) 50 MG 24 hr tablet Take 0.5 tablets by mouth daily.      . Multiple Vitamin (MULTIVITAMIN WITH MINERALS) TABS Take 1 tablet by mouth daily.      . Polyethylene Glycol 3350 (MIRALAX  PO) Take by mouth. 1 cup full in the AM      . tamsulosin (FLOMAX) 0.4 MG CAPS Take 1 capsule by mouth daily.      [name] warfarin (COUMADIN) 1 MG tablet TAKE 2 TABLETS BY MOUTH EVERY DAY OR AS DIRECTED  180 tablet  1   No current facility-administered medications for this visit.    Socially he is married has 4 children 3 grandchildren 4 great-grandchildren. There is no tobacco or alcohol use. He does not routinely exercise.  ROS is negative for fevers, chills or night sweats. He denies visual changes. He denies change in hearing. He denies lymphadenopathy. He denies rash. He denies PND or orthopnea. He denies presyncope. There is no wheezing. He denies chest pain. He denies abdominal pain. He notes occasional indigestion. He is unaware of blood in stool or urine. He denies GU symptoms. His has occasional trace ankle edema. No tremors. There is a remote history of DVT. There is no diabetes or known thyroid abnormalities. He denies difficulty with sleep. He does have a lipoma of his left tricep and a cyst in the back of his neck. Other comprehensive 14 point system review is negative.  PE BP 100/70  Pulse 61  Ht 5' 7" (1.702 m)  Wt 176 lb 6.4 oz (80.015 kg)  BMI 27.62 kg/m2  General: Alert, oriented, no distress.  Skin: normal turgor, no rashes HEENT: Normocephalic, atraumatic. Pupils round and reactive; sclera anicteric;no lid lag.  Nose without nasal septal hypertrophy Mouth/Parynx benign; Mallinpatti scale 2/3 Neck: No JVD, no carotid bruits, normal carotid upstroke. Probable epidermoid cyst on the back of his neck. Lungs: clear to ausculatation and percussion; no wheezing or rales Chest wall: No tenderness to palpation Heart: RRR, s1 s2 normal 1/6 sem Abdomen: soft, nontender; no hepatosplenomehaly, BS+; abdominal aorta nontender and not dilated by palpation. Back: No CVA tenderness Pulses 2+ Extremities: no clubbing cyanosis or edema, Homan's sign negative;  lipoma in the region of his  left tricep Neurologic: grossly nonfocal Psychological: Normal affect and mood appear  ECG( independently interpreted by me): Normal sinus rhythm at 61 beats per minute with right bundle branch block and repolarization changes. Probable left anterior hemiblock. First degree AV block with PR interval at 2:30 milliseconds.  Prior ECG of 08/18/2012: Normal sinus rhythm at 59 beats per minute with first-degree AV block. Right bundle branch block with repolarization changes.  LABS:  BMET    Component Value Date/Time   NA 138 07/17/2009 0404   K 3.8 07/17/2009 0404   CL 108 07/17/2009 0404   CO2 23 07/17/2009 0404   GLUCOSE 97 07/17/2009 0404   BUN 13 07/17/2009 0404  CREATININE 0.92 07/17/2009 0404   CALCIUM 7.9* 07/17/2009 0404   GFRNONAA >60 07/17/2009 0404   GFRAA  Value: >60        The eGFR has been calculated using the MDRD equation. This calculation has not been validated in all clinical situations. eGFR's persistently <60 mL/min signify possible Chronic Kidney Disease. 07/17/2009 0404     Hepatic Function Panel     Component Value Date/Time   PROT 7.6 07/13/2009 1131   ALBUMIN 4.0 07/13/2009 1131   AST 28 07/13/2009 1131   ALT 19 07/13/2009 1131   ALKPHOS 55 07/13/2009 1131   BILITOT 1.1 07/13/2009 1131   BILIDIR 0.1 10/23/2006 0545   IBILI 0.5 10/23/2006 0545     CBC    Component Value Date/Time   WBC 8.6 07/17/2009 0404   RBC 3.35* 07/17/2009 0404   HGB 11.7* 07/17/2009 0404   HCT 34.7* 07/17/2009 0404   PLT 232 07/17/2009 0404   MCV 103.6* 07/17/2009 0404   MCH 34.8* 07/17/2009 0404   MCHC 33.6 07/17/2009 0404   RDW 14.3 07/17/2009 0404   LYMPHSABS 2.2 07/13/2009 1131   MONOABS 0.8 07/13/2009 1131   EOSABS 0.0 07/13/2009 1131   BASOSABS 0.3* 07/13/2009 1131     BNP    Component Value Date/Time   PROBNP 943.0* 10/19/2006 1617    Lipid Panel  No results found for this basename: chol,  trig,  hdl,  cholhdl,  vldl,  ldlcalc     RADIOLOGY: No results found.    ASSESSMENT  AND PLAN: Mr. Herms continues to do fairly well and is now 7 years since his CABG revascularization surgery. At catheterization in 2008 all grafts were patent. His INR today is therapeutic at 2.0. He is on chronic Coumadin for his history of PE. He's not having any evidence for abnormal bleeding. He is not having significant symptoms of chest pain or shortness of breath. He does have evidence for  mild to moderate pulmonary hypertension on his last echo in 2012. In January cholesterol was 143 triglycerides 139 LDL 82. Although he did have low HDL level of 33.  His blood pressure today is controlled without orthostatic change with the blood pressure when rechecked by me at 110/78 supine and 110/76 standing. If he does require excision of his epidermoid cyst he will most likely need to hold his Coumadin for 3-4 days. I am checking laboratory in a fasting state on his current medical regimen and once I review these adjustments may need to be made if necessary. Clinically he is without anginal symptoms. I will see him in 6 months for cardiology reevaluation.     Troy Sine, MD, [facility]  02/15/2013 7:58 PM

## 2013-03-02 ENCOUNTER — Other Ambulatory Visit: Payer: Self-pay | Admitting: Cardiovascular Disease

## 2013-03-03 NOTE — Telephone Encounter (Signed)
Rx was sent to pharmacy electronically. 

## 2013-03-29 ENCOUNTER — Ambulatory Visit (INDEPENDENT_AMBULATORY_CARE_PROVIDER_SITE_OTHER): Payer: Medicare Other | Admitting: Pharmacist Clinician (PhC)/ Clinical Pharmacy Specialist

## 2013-03-29 DIAGNOSIS — I2699 Other pulmonary embolism without acute cor pulmonale: Secondary | ICD-10-CM

## 2013-03-29 DIAGNOSIS — Z7901 Long term (current) use of anticoagulants: Secondary | ICD-10-CM

## 2013-03-29 LAB — COMPREHENSIVE METABOLIC PANEL
ALK PHOS: 38 U/L — AB (ref 39–117)
ALT: 11 U/L (ref 0–53)
AST: 16 U/L (ref 0–37)
Albumin: 3.6 g/dL (ref 3.5–5.2)
BILIRUBIN TOTAL: 0.7 mg/dL (ref 0.2–1.2)
BUN: 23 mg/dL (ref 6–23)
CO2: 27 mEq/L (ref 19–32)
Calcium: 9.8 mg/dL (ref 8.4–10.5)
Chloride: 107 mEq/L (ref 96–112)
Creat: 1.32 mg/dL (ref 0.50–1.35)
GLUCOSE: 114 mg/dL — AB (ref 70–99)
Potassium: 4.7 mEq/L (ref 3.5–5.3)
SODIUM: 141 meq/L (ref 135–145)
Total Protein: 6.4 g/dL (ref 6.0–8.3)

## 2013-03-29 LAB — CBC
HCT: 42 % (ref 39.0–52.0)
Hemoglobin: 14.2 g/dL (ref 13.0–17.0)
MCH: 33.4 pg (ref 26.0–34.0)
MCHC: 33.8 g/dL (ref 30.0–36.0)
MCV: 98.8 fL (ref 78.0–100.0)
PLATELETS: 320 10*3/uL (ref 150–400)
RBC: 4.25 MIL/uL (ref 4.22–5.81)
RDW: 14.5 % (ref 11.5–15.5)
WBC: 8.9 10*3/uL (ref 4.0–10.5)

## 2013-03-29 LAB — LIPID PANEL
CHOL/HDL RATIO: 4 ratio
Cholesterol: 128 mg/dL (ref 0–200)
HDL: 32 mg/dL — ABNORMAL LOW (ref >39)
LDL Cholesterol: 61 mg/dL (ref 0–99)
Triglycerides: 177 mg/dL — ABNORMAL HIGH (ref <150)
VLDL: 35 mg/dL (ref 0–40)

## 2013-03-29 LAB — TSH: TSH: 1.881 u[IU]/mL (ref 0.350–4.500)

## 2013-03-29 LAB — POCT INR: INR: 3.5

## 2013-04-02 ENCOUNTER — Other Ambulatory Visit: Payer: Self-pay | Admitting: Pharmacist Clinician (PhC)/ Clinical Pharmacy Specialist

## 2013-04-19 ENCOUNTER — Telehealth: Payer: Self-pay | Admitting: Cardiovascular Disease

## 2013-04-19 ENCOUNTER — Telehealth: Payer: Self-pay | Admitting: *Deleted

## 2013-04-19 NOTE — Telephone Encounter (Signed)
Spoke with patient to give him his lab results with Dr.kelly's recommendations. Patient did not fully comprehend what I was telling him and requested that I call back later to speak with his wife.

## 2013-04-19 NOTE — Telephone Encounter (Signed)
Pease call,concerning his results you called today. Pt was a little mixed up and wanted wife to call back to get an understanding of his results.

## 2013-04-20 NOTE — Telephone Encounter (Signed)
Returned a call to patient's wife giving her patient's la results along with Dr. Evette Georges recommendations. She expressed understanding of advice.

## 2013-04-26 ENCOUNTER — Ambulatory Visit (INDEPENDENT_AMBULATORY_CARE_PROVIDER_SITE_OTHER): Payer: Medicare Other | Admitting: Pharmacist Clinician (PhC)/ Clinical Pharmacy Specialist

## 2013-04-26 VITALS — BP 110/74 | HR 68

## 2013-04-26 DIAGNOSIS — I82409 Acute embolism and thrombosis of unspecified deep veins of unspecified lower extremity: Secondary | ICD-10-CM

## 2013-04-26 DIAGNOSIS — I2699 Other pulmonary embolism without acute cor pulmonale: Secondary | ICD-10-CM

## 2013-04-26 DIAGNOSIS — Z7901 Long term (current) use of anticoagulants: Secondary | ICD-10-CM

## 2013-04-26 LAB — POCT INR: INR: 3.2

## 2013-05-24 ENCOUNTER — Ambulatory Visit (INDEPENDENT_AMBULATORY_CARE_PROVIDER_SITE_OTHER): Payer: Medicare Other | Admitting: Pharmacist Clinician (PhC)/ Clinical Pharmacy Specialist

## 2013-05-24 VITALS — BP 142/64 | HR 56

## 2013-05-24 DIAGNOSIS — Z7901 Long term (current) use of anticoagulants: Secondary | ICD-10-CM

## 2013-05-24 DIAGNOSIS — I2699 Other pulmonary embolism without acute cor pulmonale: Secondary | ICD-10-CM

## 2013-05-24 DIAGNOSIS — I82409 Acute embolism and thrombosis of unspecified deep veins of unspecified lower extremity: Secondary | ICD-10-CM

## 2013-05-24 LAB — POCT INR: INR: 3.1

## 2013-06-18 ENCOUNTER — Ambulatory Visit (INDEPENDENT_AMBULATORY_CARE_PROVIDER_SITE_OTHER): Payer: Medicare Other | Admitting: Pharmacist Clinician (PhC)/ Clinical Pharmacy Specialist

## 2013-06-18 DIAGNOSIS — Z7901 Long term (current) use of anticoagulants: Secondary | ICD-10-CM

## 2013-06-18 DIAGNOSIS — I2699 Other pulmonary embolism without acute cor pulmonale: Secondary | ICD-10-CM

## 2013-06-18 DIAGNOSIS — I82409 Acute embolism and thrombosis of unspecified deep veins of unspecified lower extremity: Secondary | ICD-10-CM

## 2013-06-18 LAB — POCT INR: INR: 2.1

## 2013-07-16 ENCOUNTER — Ambulatory Visit (INDEPENDENT_AMBULATORY_CARE_PROVIDER_SITE_OTHER): Payer: Medicare Other | Admitting: Pharmacist Clinician (PhC)/ Clinical Pharmacy Specialist

## 2013-07-16 DIAGNOSIS — I82409 Acute embolism and thrombosis of unspecified deep veins of unspecified lower extremity: Secondary | ICD-10-CM

## 2013-07-16 DIAGNOSIS — I2699 Other pulmonary embolism without acute cor pulmonale: Secondary | ICD-10-CM

## 2013-07-16 DIAGNOSIS — Z7901 Long term (current) use of anticoagulants: Secondary | ICD-10-CM

## 2013-07-16 LAB — POCT INR: INR: 2.2

## 2013-08-13 ENCOUNTER — Ambulatory Visit (INDEPENDENT_AMBULATORY_CARE_PROVIDER_SITE_OTHER): Payer: Medicare Other | Admitting: Pharmacist Clinician (PhC)/ Clinical Pharmacy Specialist

## 2013-08-13 DIAGNOSIS — I2699 Other pulmonary embolism without acute cor pulmonale: Secondary | ICD-10-CM

## 2013-08-13 DIAGNOSIS — Z7901 Long term (current) use of anticoagulants: Secondary | ICD-10-CM

## 2013-08-13 DIAGNOSIS — I82409 Acute embolism and thrombosis of unspecified deep veins of unspecified lower extremity: Secondary | ICD-10-CM

## 2013-08-13 LAB — POCT INR: INR: 2.3

## 2013-09-17 ENCOUNTER — Ambulatory Visit (INDEPENDENT_AMBULATORY_CARE_PROVIDER_SITE_OTHER): Payer: Medicare Other | Admitting: Pharmacist Clinician (PhC)/ Clinical Pharmacy Specialist

## 2013-09-17 DIAGNOSIS — I2699 Other pulmonary embolism without acute cor pulmonale: Secondary | ICD-10-CM

## 2013-09-17 DIAGNOSIS — I82409 Acute embolism and thrombosis of unspecified deep veins of unspecified lower extremity: Secondary | ICD-10-CM

## 2013-09-17 DIAGNOSIS — Z7901 Long term (current) use of anticoagulants: Secondary | ICD-10-CM

## 2013-09-17 LAB — POCT INR: INR: 2.4

## 2013-10-23 ENCOUNTER — Other Ambulatory Visit: Payer: Self-pay | Admitting: Cardiovascular Disease

## 2013-10-25 NOTE — Telephone Encounter (Signed)
Rx was sent to pharmacy electronically. 

## 2013-10-27 ENCOUNTER — Encounter: Payer: Self-pay | Admitting: Cardiovascular Disease

## 2013-10-27 ENCOUNTER — Ambulatory Visit (INDEPENDENT_AMBULATORY_CARE_PROVIDER_SITE_OTHER): Payer: Medicare Other | Admitting: Pharmacist Clinician (PhC)/ Clinical Pharmacy Specialist

## 2013-10-27 ENCOUNTER — Ambulatory Visit (INDEPENDENT_AMBULATORY_CARE_PROVIDER_SITE_OTHER): Payer: Medicare Other | Admitting: Cardiovascular Disease

## 2013-10-27 VITALS — BP 122/80 | HR 57 | Ht 65.0 in | Wt 166.6 lb

## 2013-10-27 DIAGNOSIS — Z7901 Long term (current) use of anticoagulants: Secondary | ICD-10-CM

## 2013-10-27 DIAGNOSIS — Z23 Encounter for immunization: Secondary | ICD-10-CM

## 2013-10-27 DIAGNOSIS — I2510 Atherosclerotic heart disease of native coronary artery without angina pectoris: Secondary | ICD-10-CM

## 2013-10-27 DIAGNOSIS — I82409 Acute embolism and thrombosis of unspecified deep veins of unspecified lower extremity: Secondary | ICD-10-CM

## 2013-10-27 DIAGNOSIS — E785 Hyperlipidemia, unspecified: Secondary | ICD-10-CM

## 2013-10-27 DIAGNOSIS — I2699 Other pulmonary embolism without acute cor pulmonale: Secondary | ICD-10-CM

## 2013-10-27 DIAGNOSIS — I4510 Unspecified right bundle-branch block: Secondary | ICD-10-CM

## 2013-10-27 DIAGNOSIS — I10 Essential (primary) hypertension: Secondary | ICD-10-CM

## 2013-10-27 LAB — POCT INR: INR: 3.5

## 2013-10-27 MED ORDER — METOPROLOL SUCCINATE ER 25 MG PO TB24: ORAL_TABLET | ORAL | Status: DC

## 2013-10-27 NOTE — Progress Notes (Signed)
Patient ID: Richard Mendez, male   DOB: 1925/05/05, 80 y.o.   MRN: [id number]      HPI: Richard Mendez is a 80 y.o. male who presents to the office for a 19-monthcardiology evaluation.  Mr. HEnkehas known CAD and underwent CABG surgery on 02/06/2006 by Dr.Van Tright with a LIMA placed to the LAD, a vein to the diagonal, vein to the OM 2, and vein to the PDA. His last cardiac catheterization September 2008 showed that all grafts were patent. He did have left main occlusion and an 80% RCA stenosis involving the PLA vessel. He has a history of a right DVT with subsequent PE and is a been on Coumadin anticoagulation.  Additional problems include a history of right bundle branch block, hypertension, as well as hyperlipidemia. He does admit to occasional hip discomfort related to his prior hip replacement surgery in 1997.  Over the past 9 months, he has continued to be stable. He denies symptoms of exertional chest pain. At times he has noticed a vague chest discomfort in the left chest wall which he feels it improves with belching and is not exertionally related.  At times, he notes rare episodes of dizziness, particularly when standing up.  He apparently had a reaction to [name]  An INR was checked and this was increased at 3.5.  He tells me he recently had laboratory done by [name] [facility].  He presents for evaluation.   Past Medical History  Diagnosis Date  . Hypertension   . Hyperlipidemia   . RBBB (right bundle branch block)   . CAD (coronary artery disease)     CABG  . Hx of echocardiogram 11/07/2010    showed normal systolic function, grade 2 diastolic dysfunction and also had abnormal tissue doppler suggesting increased LA pressure, left atrium was mildly dilated, mild to moderate mitral annular calcification with mild MR, moderate tricuspid regurgitation with moderate pulmonary hypertension with estimated RV systolic pressure of 41; trace AR and trace PR  . History of stress  test 09/2008    no change from previous, no significant ischemia, and EF of 68%  . S/P CABG (coronary artery bypass graft) 01/2006  . History of DVT (deep vein thrombosis)     right  . History of pulmonary embolism   . Colon cancer 1997    surgery     Past Surgical History  Procedure Laterality Date  . Coronary artery bypass graft  02/06/2006    LIMA placed to the LAD, a vein to the diagonal, a vein to the OM2 and a vein to the PDA. ([name] - diagnostic cath by [name] 01/31/2006  . Cardiac catheterization  9/292008    severe native CAD with severe diffuse coronary calcif & total occlusion of L main & diffuse native RCA with 30% prox, 70-80% mid, diffused 50% stenosis before & after the crux, 80% distal RCA stenosis involving PLA; patent vein graft to PDA of RCA, patent vein graft to OM2 of Cfx, patient vein graft supplying diagonal, patent LIMA to mid-distal LAD ([name]  . Colonoscopy    . Cholecystectomy  2003  . Hernia repair  2006  . Splenectomy  1944  . Ankle surgery  1978    ankle fracture  . Transthoracic echocardiogram  10/2010    EF=>55%, LA mildly dilated, RA mildly dilated; mild-mod mitral annular calcif, miild MR, mod TR, mod pulm HTN, RSVP 40-570m; trace AV regurg, trace pulm  valve regurg  . Nm myocar perf wall motion  10/2010    lexiscan; normal patten of perfusion in all regions; post-stress EF 70%, occ PVCs throughout the study, low risk scan     Allergies  Allergen Reactions  . Vesicare [Solifenacin]     Altered mental status  . Morphine And Related Itching and Rash    Current Outpatient Prescriptions  Medication Sig Dispense Refill  . aspirin 81 MG tablet Take 81 mg by mouth daily.      . Calcium Carbonate-Vitamin D (CALCIUM + D PO) Take 1 tablet by mouth 2 (two) times daily.      . CRESTOR 10 MG tablet TAKE 1 TABLET BY MOUTH EVERY NIGHT AT BEDTIME  30 tablet  3  . finasteride (PROSCAR) 5 MG tablet Take 1 tablet by mouth daily.      .  metoprolol succinate (TOPROL-XL) 50 MG 24 hr tablet TAKE 1/2 TABLET BY MOUTH EVERY DAY  45 tablet  3  . Multiple Vitamin (MULTIVITAMIN WITH MINERALS) TABS Take 1 tablet by mouth daily.      . Polyethylene Glycol 3350 (MIRALAX PO) Take by mouth. 1 cup full in the AM      . tamsulosin (FLOMAX) 0.4 MG CAPS Take 1 capsule by mouth daily.      [name] warfarin (COUMADIN) 1 MG tablet TAKE 2 TABLETS BY MOUTH EVERY DAY OR AS DIRECTED  180 tablet  1   No current facility-administered medications for this visit.    Socially he is married has 4 children 3 grandchildren 4 great-grandchildren. There is no tobacco or alcohol use. He does not routinely exercise.  ROS General: Negative; No fevers, chills, or night sweats;  HEENT: Negative; No changes in vision or hearing, sinus congestion, difficulty swallowing Pulmonary: Negative; No cough, wheezing, shortness of breath, hemoptysis Cardiovascular: Negative; No chest pain, presyncope, syncope, palpitations GI: Negative; No nausea, vomiting, diarrhea, or abdominal pain GU: Negative; No dysuria, hematuria, or difficulty voiding Musculoskeletal: Negative; no myalgias, joint pain, or weakness Hematologic/Oncology: Negative; no easy bruising, bleeding Endocrine: Negative; no heat/cold intolerance; no diabetes Neuro: Negative; no changes in balance, headaches Skin: Lipoma of his left tricep cyst in the back of his neck; No rashes or skin lesions Psychiatric: Negative; No behavioral problems, depression Sleep: Negative; No snoring, daytime sleepiness, hypersomnolence, bruxism, restless legs, hypnogognic hallucinations, no cataplexy Other comprehensive 14 point system review is negative.   PE BP 122/80  Pulse 57  Ht _0  (1.651 m)  Wt 166 lb 9.6 oz (75.569 kg)  BMI 27.72 kg/m2  General: Alert, oriented, no distress.  Skin: normal turgor, no rashes HEENT: Normocephalic, atraumatic. Pupils round and reactive; sclera anicteric;no lid lag.  Nose without nasal  septal hypertrophy Mouth/Parynx benign; Mallinpatti scale 2/3 Neck: No JVD, no carotid bruits, normal carotid upstroke. Probable epidermoid cyst on the back of his neck. Lungs: clear to ausculatation and percussion; no wheezing or rales Chest wall: No tenderness to palpation Heart: RRR, s1 s2 normal 1/6 sem; no diastolic murmur.  No rubs, thrills or heaves. Abdomen: soft, nontender; no hepatosplenomehaly, BS+; abdominal aorta nontender and not dilated by palpation. Back: No CVA tenderness Pulses 2+ Extremities: no clubbing cyanosis or edema, Homan's sign negative;  lipoma in the region of his left tricep Neurologic: grossly nonfocal Psychological: Normal affect and mood appear  ECG (independently read by me): Sinus bradycardia at 57 beats per minute.  Right bundle branch block with repolarization changes.  First degree AV block with a PR interval  226 ms.  Prior January 2015 ECG( independently interpreted by me): Normal sinus rhythm at 61 beats per minute with right bundle branch block and repolarization changes. Probable left anterior hemiblock. First degree AV block with PR interval at 2:30 milliseconds.  Prior ECG of 08/18/2012: Normal sinus rhythm at 59 beats per minute with first-degree AV block. Right bundle branch block with repolarization changes.  LABS:  BMET    Component Value Date/Time   NA 141 03/29/2013 1047   K 4.7 03/29/2013 1047   CL 107 03/29/2013 1047   CO2 27 03/29/2013 1047   GLUCOSE 114* 03/29/2013 1047   BUN 23 03/29/2013 1047   CREATININE 1.32 03/29/2013 1047   CREATININE 0.92 07/17/2009 0404   CALCIUM 9.8 03/29/2013 1047   GFRNONAA >60 07/17/2009 0404   GFRAA  Value: >60        The eGFR has been calculated using the MDRD equation. This calculation has not been validated in all clinical situations. eGFR's persistently <60 mL/min signify possible Chronic Kidney Disease. 07/17/2009 0404     Hepatic Function Panel     Component Value Date/Time   PROT 6.4 03/29/2013 1047    ALBUMIN 3.6 03/29/2013 1047   AST 16 03/29/2013 1047   ALT 11 03/29/2013 1047   ALKPHOS 38* 03/29/2013 1047   BILITOT 0.7 03/29/2013 1047   BILIDIR 0.1 10/23/2006 0545   IBILI 0.5 10/23/2006 0545     CBC    Component Value Date/Time   WBC 8.9 03/29/2013 1047   RBC 4.25 03/29/2013 1047   HGB 14.2 03/29/2013 1047   HCT 42.0 03/29/2013 1047   PLT 320 03/29/2013 1047   MCV 98.8 03/29/2013 1047   MCH 33.4 03/29/2013 1047   MCHC 33.8 03/29/2013 1047   RDW 14.5 03/29/2013 1047   LYMPHSABS 2.2 07/13/2009 1131   MONOABS 0.8 07/13/2009 1131   EOSABS 0.0 07/13/2009 1131   BASOSABS 0.3* 07/13/2009 1131     BNP    Component Value Date/Time   PROBNP 943.0* 10/19/2006 1617    Lipid Panel     Component Value Date/Time   CHOL 128 03/29/2013 1047     RADIOLOGY: No results found.    ASSESSMENT AND PLAN: Mr. Fenlon is an 80 year old male, who is 7-1/2 years status post  CABG revascularization surgery. At catheterization in 2008 all grafts were patent.  He is on chronic Coumadin for his history of PE. He's not having any evidence for abnormal bleeding.  His INR today is increased at 3.5 and dose adjustment to his Coumadin was done.  He is not having significant symptoms of chest pain or shortness of breath. He does have evidence for  mild to moderate pulmonary hypertension on his last echo in 2012.  He has noticed rare episodes of dizziness.  I repeated blood pressure.  Assessment today, both in supine and standing positions.  These were essentially identical at 130/80 without orthostatic change in blood pressure or pulse rise.  He is bradycardic.  I am recommending further reduction of his Toprol-XL to 12.5 mg daily and have changed his prescription to a 25 mg pill.  I will try to obtain the blood work that was recently done by Dr. Kathryne Eriksson.  Aggressive lipid intervention is essential with a target LDL less than 70.  I will see him in 6 months for cardiology reevaluation or sooner if problems arise.    Troy Sine,  MD, [facility]  10/27/2013 12:37 PM

## 2013-10-27 NOTE — Patient Instructions (Signed)
Your physician has recommended you make the following change in your medication: decrease the metoprolol SUCC to 12.5 mg daily. A new prescription has been sent to your pharmacy.  Your physician wants you to follow-up in: 6 months or sooner if needed. You will receive a reminder letter in the mail two months in advance. If you don't receive a letter, please call our office to schedule the follow-up appointment.  Influenza Virus Vaccine injection What is this medicine? INFLUENZA VIRUS VACCINE (in floo EN zuh VAHY ruhs vak SEEN) helps to reduce the risk of getting influenza also known as the flu. The vaccine only helps protect you against some strains of the flu. This medicine may be used for other purposes; ask your health care provider or pharmacist if you have questions. COMMON BRAND NAME(S): Afluria, Agriflu, Fluarix, Fluarix Quadrivalent, FLUCELVAX, Flulaval, Fluvirin, Fluzone, Fluzone High-Dose, Fluzone Intradermal What should I tell my health care provider before I take this medicine? They need to know if you have any of these conditions: -bleeding disorder like hemophilia -fever or infection -Guillain-Barre syndrome or other neurological problems -immune system problems -infection with the human immunodeficiency virus (HIV) or AIDS -low blood platelet counts -multiple sclerosis -an unusual or allergic reaction to influenza virus vaccine, latex, other medicines, foods, dyes, or preservatives. Different brands of vaccines contain different allergens. Some may contain latex or eggs. Talk to your doctor about your allergies to make sure that you get the right vaccine. -pregnant or trying to get pregnant -breast-feeding How should I use this medicine? This vaccine is for injection into a muscle or under the skin. It is given by a health care professional. A copy of Vaccine Information Statements will be given before each vaccination. Read this sheet carefully each time. The sheet may change  frequently. Talk to your healthcare provider to see which vaccines are right for you. Some vaccines should not be used in all age groups. Overdosage: If you think you have taken too much of this medicine contact a poison control center or emergency room at once. NOTE: This medicine is only for you. Do not share this medicine with others. What if I miss a dose? This does not apply. What may interact with this medicine? -chemotherapy or radiation therapy -medicines that lower your immune system like etanercept, anakinra, infliximab, and adalimumab -medicines that treat or prevent blood clots like warfarin -phenytoin -steroid medicines like prednisone or cortisone -theophylline -vaccines This list may not describe all possible interactions. Give your health care provider a list of all the medicines, herbs, non-prescription drugs, or dietary supplements you use. Also tell them if you smoke, drink alcohol, or use illegal drugs. Some items may interact with your medicine. What should I watch for while using this medicine? Report any side effects that do not go away within 3 days to your doctor or health care professional. Call your health care provider if any unusual symptoms occur within 6 weeks of receiving this vaccine. You may still catch the flu, but the illness is not usually as bad. You cannot get the flu from the vaccine. The vaccine will not protect against colds or other illnesses that may cause fever. The vaccine is needed every year. What side effects may I notice from receiving this medicine? Side effects that you should report to your doctor or health care professional as soon as possible: -allergic reactions like skin rash, itching or hives, swelling of the face, lips, or tongue Side effects that usually do not require medical  attention (report to your doctor or health care professional if they continue or are bothersome): -fever -headache -muscle aches and pains -pain, tenderness,  redness, or swelling at the injection site -tiredness This list may not describe all possible side effects. Call your doctor for medical advice about side effects. You may report side effects to FDA at 1-800-FDA-1088. Where should I keep my medicine? The vaccine will be given by a health care professional in a clinic, pharmacy, doctor's office, or other health care setting. You will not be given vaccine doses to store at home. NOTE: This sheet is a summary. It may not cover all possible information. If you have questions about this medicine, talk to your doctor, pharmacist, or health care provider.  2015, [employer]. (2011-07-18 34:74:25)

## 2013-11-17 ENCOUNTER — Ambulatory Visit (INDEPENDENT_AMBULATORY_CARE_PROVIDER_SITE_OTHER): Payer: Medicare Other | Admitting: Pharmacist Clinician (PhC)/ Clinical Pharmacy Specialist

## 2013-11-17 DIAGNOSIS — Z7901 Long term (current) use of anticoagulants: Secondary | ICD-10-CM

## 2013-11-17 DIAGNOSIS — I2699 Other pulmonary embolism without acute cor pulmonale: Secondary | ICD-10-CM

## 2013-11-17 DIAGNOSIS — I82409 Acute embolism and thrombosis of unspecified deep veins of unspecified lower extremity: Secondary | ICD-10-CM

## 2013-11-17 LAB — POCT INR: INR: 3.1

## 2013-12-08 ENCOUNTER — Ambulatory Visit (INDEPENDENT_AMBULATORY_CARE_PROVIDER_SITE_OTHER): Payer: Medicare Other | Admitting: Pharmacist Clinician (PhC)/ Clinical Pharmacy Specialist

## 2013-12-08 DIAGNOSIS — I2699 Other pulmonary embolism without acute cor pulmonale: Secondary | ICD-10-CM

## 2013-12-08 DIAGNOSIS — I82409 Acute embolism and thrombosis of unspecified deep veins of unspecified lower extremity: Secondary | ICD-10-CM

## 2013-12-08 DIAGNOSIS — Z7901 Long term (current) use of anticoagulants: Secondary | ICD-10-CM

## 2013-12-08 LAB — POCT INR: INR: 2.5

## 2014-01-05 ENCOUNTER — Ambulatory Visit (INDEPENDENT_AMBULATORY_CARE_PROVIDER_SITE_OTHER): Payer: Medicare Other | Admitting: Pharmacist Clinician (PhC)/ Clinical Pharmacy Specialist

## 2014-01-05 DIAGNOSIS — I2699 Other pulmonary embolism without acute cor pulmonale: Secondary | ICD-10-CM

## 2014-01-05 DIAGNOSIS — I82409 Acute embolism and thrombosis of unspecified deep veins of unspecified lower extremity: Secondary | ICD-10-CM

## 2014-01-05 DIAGNOSIS — Z7901 Long term (current) use of anticoagulants: Secondary | ICD-10-CM

## 2014-01-05 LAB — POCT INR: INR: 2.9

## 2014-02-16 ENCOUNTER — Ambulatory Visit (INDEPENDENT_AMBULATORY_CARE_PROVIDER_SITE_OTHER): Payer: Medicare Other | Admitting: Pharmacist Clinician (PhC)/ Clinical Pharmacy Specialist

## 2014-02-16 DIAGNOSIS — I82409 Acute embolism and thrombosis of unspecified deep veins of unspecified lower extremity: Secondary | ICD-10-CM

## 2014-02-16 DIAGNOSIS — I2699 Other pulmonary embolism without acute cor pulmonale: Secondary | ICD-10-CM

## 2014-02-16 DIAGNOSIS — Z7901 Long term (current) use of anticoagulants: Secondary | ICD-10-CM

## 2014-02-16 LAB — POCT INR: INR: 2.1

## 2014-02-21 ENCOUNTER — Other Ambulatory Visit: Payer: Self-pay | Admitting: Cardiovascular Disease

## 2014-02-21 NOTE — Telephone Encounter (Signed)
Rx refill sent to patient pharmacy   

## 2014-03-30 ENCOUNTER — Ambulatory Visit (INDEPENDENT_AMBULATORY_CARE_PROVIDER_SITE_OTHER): Payer: Medicare Other | Admitting: Pharmacist Clinician (PhC)/ Clinical Pharmacy Specialist

## 2014-03-30 DIAGNOSIS — I2699 Other pulmonary embolism without acute cor pulmonale: Secondary | ICD-10-CM

## 2014-03-30 DIAGNOSIS — Z7901 Long term (current) use of anticoagulants: Secondary | ICD-10-CM

## 2014-03-30 DIAGNOSIS — I82409 Acute embolism and thrombosis of unspecified deep veins of unspecified lower extremity: Secondary | ICD-10-CM

## 2014-03-30 LAB — POCT INR: INR: 2.5

## 2014-03-31 ENCOUNTER — Other Ambulatory Visit: Payer: Self-pay | Admitting: Pharmacist Clinician (PhC)/ Clinical Pharmacy Specialist

## 2014-03-31 MED ORDER — WARFARIN SODIUM 1 MG PO TABS: ORAL_TABLET | ORAL | Status: DC

## 2014-04-08 ENCOUNTER — Telehealth: Payer: Self-pay | Admitting: Cardiovascular Disease

## 2014-04-11 NOTE — Telephone Encounter (Signed)
Closed encounter °

## 2014-05-12 ENCOUNTER — Ambulatory Visit (INDEPENDENT_AMBULATORY_CARE_PROVIDER_SITE_OTHER): Payer: Medicare Other | Admitting: Cardiovascular Disease

## 2014-05-12 ENCOUNTER — Encounter: Payer: Self-pay | Admitting: Cardiovascular Disease

## 2014-05-12 ENCOUNTER — Ambulatory Visit (INDEPENDENT_AMBULATORY_CARE_PROVIDER_SITE_OTHER): Payer: Medicare Other | Admitting: Pharmacist Clinician (PhC)/ Clinical Pharmacy Specialist

## 2014-05-12 VITALS — BP 118/64 | HR 60 | Ht 64.0 in | Wt 180.5 lb

## 2014-05-12 DIAGNOSIS — I82409 Acute embolism and thrombosis of unspecified deep veins of unspecified lower extremity: Secondary | ICD-10-CM | POA: Diagnosis not present

## 2014-05-12 DIAGNOSIS — E785 Hyperlipidemia, unspecified: Secondary | ICD-10-CM

## 2014-05-12 DIAGNOSIS — I25810 Atherosclerosis of coronary artery bypass graft(s) without angina pectoris: Secondary | ICD-10-CM | POA: Diagnosis not present

## 2014-05-12 DIAGNOSIS — I2510 Atherosclerotic heart disease of native coronary artery without angina pectoris: Secondary | ICD-10-CM

## 2014-05-12 DIAGNOSIS — Z7901 Long term (current) use of anticoagulants: Secondary | ICD-10-CM

## 2014-05-12 DIAGNOSIS — I4510 Unspecified right bundle-branch block: Secondary | ICD-10-CM

## 2014-05-12 DIAGNOSIS — I10 Essential (primary) hypertension: Secondary | ICD-10-CM | POA: Diagnosis not present

## 2014-05-12 DIAGNOSIS — I2699 Other pulmonary embolism without acute cor pulmonale: Secondary | ICD-10-CM | POA: Diagnosis not present

## 2014-05-12 LAB — POCT INR: INR: 2.5

## 2014-05-12 MED ORDER — PANTOPRAZOLE SODIUM 40 MG PO TBEC: 40.0000 mg | DELAYED_RELEASE_TABLET | Freq: Every day | ORAL | Status: DC

## 2014-05-12 NOTE — Patient Instructions (Addendum)
Your physician recommends that you return for lab work fasting.  Start new pantoprazole prescription as directed on the bottle. This has already been sent to your pharmacy.   Your physician wants you to follow-up in: 6 months or sooner if needed with Dr. Claiborne Billings. You will receive a reminder letter in the mail two months in advance. If you don't receive a letter, please call our office to schedule the follow-up appointment.

## 2014-05-12 NOTE — Progress Notes (Signed)
Patient ID: Richard Mendez, male   DOB: 03-20-1925, 81 y.o.   MRN: [id number]      HPI: Richard Mendez is a 81 y.o. male who presents to the office for a 58monthcardiology evaluation.  Richard Mendez known CAD and underwent CABG surgery on 02/06/2006 by Dr.Van Tright with a LIMA  to the LAD, a vein to the diagonal, vein to the OM 2, and vein to the PDA. His last cardiac catheterization September 2008 showed that all grafts were patent. He did have left main occlusion and an 80% RCA stenosis involving the PLA vessel. He has a history of a right DVT with subsequent PE and is a been on Coumadin anticoagulation.  Additional problems include a history of right bundle branch block, hypertension, as well as hyperlipidemia. He does admit to occasional hip discomfort related to his prior hip replacement surgery in 1997.   when I last saw him, he was complaining of episodes of some mild dizziness particularly with standing up. He was bradycardic.  I reduced his Toprol dose from  25 mg to 12.5 mg daily.  His symptoms did improve.  He denies recent episodes of chest pain.  However, he admits to indigestion symptoms, which seem to be relieved with Tums.  However, he is noticing that he is having to take Tums more frequently.  He specifically denies any exertional percent dictation to this discomfort.  He is not very active in either walks with a cane or is in a wheelchair.  He is not had laboratory checked in over a year.  He is on Coumadin anticoagulation.  His INRs have been therapeutic.  His INR today was 2.5.  Over the past year, he had a significant reaction to Vesicare, which was prescribed by his urologist with significant  mentalstatus changes.    Past Medical History  Diagnosis Date  . Hypertension   . Hyperlipidemia   . RBBB (right bundle branch block)   . CAD (coronary artery disease)     CABG  . Hx of echocardiogram 11/07/2010    showed normal systolic function, grade 2 diastolic dysfunction and  also had abnormal tissue doppler suggesting increased LA pressure, left atrium was mildly dilated, mild to moderate mitral annular calcification with mild MR, moderate tricuspid regurgitation with moderate pulmonary hypertension with estimated RV systolic pressure of 41; trace AR and trace PR  . History of stress test 09/2008    no change from previous, no significant ischemia, and EF of 68%  . S/P CABG (coronary artery bypass graft) 01/2006  . History of DVT (deep vein thrombosis)     right  . History of pulmonary embolism   . Colon cancer 1997    surgery     Past Surgical History  Procedure Laterality Date  . Coronary artery bypass graft  02/06/2006    LIMA placed to the LAD, a vein to the diagonal, a vein to the OM2 and a vein to the PDA. ([name] - diagnostic cath by [name] 01/31/2006  . Cardiac catheterization  9/292008    severe native CAD with severe diffuse coronary calcif & total occlusion of L main & diffuse native RCA with 30% prox, 70-80% mid, diffused 50% stenosis before & after the crux, 80% distal RCA stenosis involving PLA; patent vein graft to PDA of RCA, patent vein graft to OM2 of Cfx, patient vein graft supplying diagonal, patent LIMA to mid-distal LAD ([name]  . Colonoscopy    .  Cholecystectomy  2003  . Hernia repair  2006  . Splenectomy  1944  . Ankle surgery  1978    ankle fracture  . Transthoracic echocardiogram  10/2010    EF=>55%, LA mildly dilated, RA mildly dilated; mild-mod mitral annular calcif, miild MR, mod TR, mod pulm HTN, RSVP 40-65mH; trace AV regurg, trace pulm valve regurg  . Nm myocar perf wall motion  10/2010    lexiscan; normal patten of perfusion in all regions; post-stress EF 70%, occ PVCs throughout the study, low risk scan     Allergies  Allergen Reactions  . Vesicare [Solifenacin]     Altered mental status  . Morphine And Related Itching and Rash    Current Outpatient Prescriptions  Medication Sig Dispense Refill    . aspirin 81 MG tablet Take 81 mg by mouth daily.    . CRESTOR 10 MG tablet TAKE 1 TABLET BY MOUTH EVERY NIGHT AT BEDTIME 30 tablet 10  . finasteride (PROSCAR) 5 MG tablet Take 1 tablet by mouth daily.    . metoprolol succinate (TOPROL XL) 25 MG 24 hr tablet 1/2 pill daily 30 tablet 3  . Polyethylene Glycol 3350 (MIRALAX PO) Take by mouth. 1 cup full in the AM    . tamsulosin (FLOMAX) 0.4 MG CAPS Take 1 capsule by mouth daily.    .Marland Kitchenwarfarin (COUMADIN) 1 MG tablet TAKE 2 TABLETS BY MOUTH EVERY DAY OR AS DIRECTED 180 tablet 1   No current facility-administered medications for this visit.    Socially he is married has 4 children 3 grandchildren 4 great-grandchildren. There is no tobacco or alcohol use. He does not routinely exercise.  ROS General: Negative; No fevers, chills, or night sweats;  HEENT: Negative; No changes in vision or hearing, sinus congestion, difficulty swallowing Pulmonary: Negative; No cough, wheezing, shortness of breath, hemoptysis Cardiovascular: Negative; No chest pain, presyncope, syncope, palpitations GI:  Positive for indigestion GU: Negative; No dysuria, hematuria, or difficulty voiding Musculoskeletal: Negative; no myalgias, joint pain, or weakness Hematologic/Oncology: Negative; no easy bruising, bleeding Endocrine: Negative; no heat/cold intolerance; no diabetes Neuro: Negative; no changes in balance, headaches Skin: Lipoma of his left tricep cyst in the back of his neck; No rashes or skin lesions Psychiatric: Negative; No behavioral problems, depression Sleep: Negative; No snoring, daytime sleepiness, hypersomnolence, bruxism, restless legs, hypnogognic hallucinations, no cataplexy Other comprehensive 14 point system review is negative.   PE BP 118/64 mmHg  Pulse 60  Ht '5\' 4"'  (1.626 m)  Wt 180 lb 8 oz (81.874 kg)  BMI 30.97 kg/m2  General: Alert, oriented, no distress.  Skin: normal turgor, no rashes HEENT: Normocephalic, atraumatic. Pupils round  and reactive; sclera anicteric;no lid lag.  Nose without nasal septal hypertrophy Mouth/Parynx benign; Mallinpatti scale 2/3 Neck: No JVD, no carotid bruits, normal carotid upstroke. Probable epidermoid cyst on the back of his neck. Lungs: clear to ausculatation and percussion; no wheezing or rales Chest wall: No tenderness to palpation Heart: RRR, s1 s2 normal 1/6 sem; no diastolic murmur.  No rubs, thrills or heaves. Abdomen: soft, nontender; no hepatosplenomehaly, BS+; abdominal aorta nontender and not dilated by palpation. Back: No CVA tenderness Pulses 2+ Extremities:  Trivial left ankle edema , none on the right; no clubbing, cyanosis, Homan's sign negative;  lipoma in the region of his left tricep Neurologic: grossly nonfocal Psychological: Normal affect and mood   ECG (independently read by me):  Normal sinus rhythm at 60 bpm.  Right bundle branch block with repolarization changes.  First-degree AV block with a PR interval at 248 ms.  October 2015ECG (independently read by me): Sinus bradycardia at 57 beats per minute.  Right bundle branch block with repolarization changes.  First degree AV block with a PR interval 226 ms.  Prior January 2015 ECG( independently interpreted by me): Normal sinus rhythm at 61 beats per minute with right bundle branch block and repolarization changes. Probable left anterior hemiblock. First degree AV block with PR interval at 2:30 milliseconds.  Prior ECG of 08/18/2012: Normal sinus rhythm at 59 beats per minute with first-degree AV block. Right bundle branch block with repolarization changes.  LABS:  BMP Latest Ref Rng 03/29/2013 07/17/2009 07/16/2009  Glucose 70 - 99 mg/dL 114(H) 97 70  BUN 6 - 23 mg/dL '23 13 22  ' Creatinine 0.50 - 1.35 mg/dL 1.32 0.92 0.86  Sodium 135 - 145 mEq/L 141 138 143  Potassium 3.5 - 5.3 mEq/L 4.7 3.8 3.9  Chloride 96 - 112 mEq/L 107 108 113(H)  CO2 19 - 32 mEq/L '27 23 20  ' Calcium 8.4 - 10.5 mg/dL 9.8 7.9(L) 7.9(L)      Hepatic Function Latest Ref Rng 03/29/2013 07/13/2009 10/25/2006  Total Protein 6.0 - 8.3 g/dL 6.4 7.6 6.2  Albumin 3.5 - 5.2 g/dL 3.6 4.0 3.0(L)  AST 0 - 37 U/L '16 28 21  ' ALT 0 - 53 U/L '11 19 22  ' Alk Phosphatase 39 - 117 U/L 38(L) 55 33(L)  Total Bilirubin 0.2 - 1.2 mg/dL 0.7 1.1 0.8  Bilirubin, Direct - - - -    CBC   CBC Latest Ref Rng 03/29/2013 07/17/2009 07/16/2009  WBC 4.0 - 10.5 K/uL 8.9 8.6 11.9(H)  Hemoglobin 13.0 - 17.0 g/dL 14.2 11.7(L) 11.5(L)  Hematocrit 39.0 - 52.0 % 42.0 34.7(L) 34.0(L)  Platelets 150 - 400 K/uL 320 232 225    BNP    Component Value Date/Time   PROBNP 943.0* 10/19/2006 1617      Lipid Panel     Component Value Date/Time   CHOL 128 03/29/2013 1047   TRIG 177* 03/29/2013 1047   HDL 32* 03/29/2013 1047   CHOLHDL 4.0 03/29/2013 1047   VLDL 35 03/29/2013 1047   LDLCALC 61 03/29/2013 1047     RADIOLOGY: No results found.    ASSESSMENT AND PLAN: Mr. Campos is an 81 year old male, who is 8 years status post  CABG revascularization surgery. At catheterization in 2008 all grafts were patent.  He is on chronic Coumadin for his history of PE. He's not having any evidence for abnormal bleeding.  His INR today is therapeutic at 2.5.  He no longer is having symptoms of dizziness. He is tolerating reduced dose of Toprol at 12.5 mg daily.  He is on Crestor 10 mg for hyperlipidemia.  His last laboratory was one year ago and I will recheck complete set of blood work.  Target LDL is less than 70.  He does have indigestion symptoms, which she states are probably relieved with Tums.  I am giving him  a prescription for Protonix 40 mg.  He will contact us if his symptoms do not improve. His blood pressure today is stable. He is not having any anginal symptoms. I will contact him regarding his blood work results and adjustments will be made to his medical regimen if necessary.  I will see him in 6 months for reevaluation.   Time spent: 25 minutes  Troy Sine, MD, [facility]  05/12/2014 12:14 PM

## 2014-05-16 ENCOUNTER — Ambulatory Visit: Payer: Medicare Other | Admitting: Cardiovascular Disease

## 2014-05-16 ENCOUNTER — Ambulatory Visit: Payer: Medicare Other | Admitting: Pharmacist Clinician (PhC)/ Clinical Pharmacy Specialist

## 2014-06-09 ENCOUNTER — Other Ambulatory Visit: Payer: Self-pay | Admitting: Cardiovascular Disease

## 2014-06-09 NOTE — Telephone Encounter (Signed)
Rx has been sent to the pharmacy electronically. ° °

## 2014-06-16 LAB — COMPREHENSIVE METABOLIC PANEL
ALBUMIN: 3.7 g/dL (ref 3.5–5.2)
ALK PHOS: 53 U/L (ref 39–117)
ALT: 13 U/L (ref 0–53)
AST: 16 U/L (ref 0–37)
BUN: 22 mg/dL (ref 6–23)
CO2: 23 meq/L (ref 19–32)
CREATININE: 1.31 mg/dL (ref 0.50–1.35)
Calcium: 9.5 mg/dL (ref 8.4–10.5)
Chloride: 105 mEq/L (ref 96–112)
Glucose, Bld: 135 mg/dL — ABNORMAL HIGH (ref 70–99)
Potassium: 4.7 mEq/L (ref 3.5–5.3)
Sodium: 139 mEq/L (ref 135–145)
TOTAL PROTEIN: 7.2 g/dL (ref 6.0–8.3)
Total Bilirubin: 1 mg/dL (ref 0.2–1.2)

## 2014-06-16 LAB — CBC
HCT: 45.9 % (ref 39.0–52.0)
Hemoglobin: 15.3 g/dL (ref 13.0–17.0)
MCH: 33.3 pg (ref 26.0–34.0)
MCHC: 33.3 g/dL (ref 30.0–36.0)
MCV: 99.8 fL (ref 78.0–100.0)
MPV: 10.3 fL (ref 8.6–12.4)
Platelets: 347 10*3/uL (ref 150–400)
RBC: 4.6 MIL/uL (ref 4.22–5.81)
RDW: 14.3 % (ref 11.5–15.5)
WBC: 8.8 10*3/uL (ref 4.0–10.5)

## 2014-06-16 LAB — LIPID PANEL
CHOL/HDL RATIO: 4.7 ratio
Cholesterol: 149 mg/dL (ref 0–200)
HDL: 32 mg/dL — ABNORMAL LOW (ref >=40)
LDL Cholesterol: 87 mg/dL (ref 0–99)
Triglycerides: 149 mg/dL (ref <150)
VLDL: 30 mg/dL (ref 0–40)

## 2014-06-22 ENCOUNTER — Ambulatory Visit (INDEPENDENT_AMBULATORY_CARE_PROVIDER_SITE_OTHER): Payer: Medicare Other | Admitting: Pharmacist Clinician (PhC)/ Clinical Pharmacy Specialist

## 2014-06-22 DIAGNOSIS — I82409 Acute embolism and thrombosis of unspecified deep veins of unspecified lower extremity: Secondary | ICD-10-CM

## 2014-06-22 DIAGNOSIS — Z7901 Long term (current) use of anticoagulants: Secondary | ICD-10-CM | POA: Diagnosis not present

## 2014-06-22 DIAGNOSIS — I2699 Other pulmonary embolism without acute cor pulmonale: Secondary | ICD-10-CM | POA: Diagnosis not present

## 2014-06-22 LAB — POCT INR: INR: 2.7

## 2014-06-24 ENCOUNTER — Encounter: Payer: Self-pay | Admitting: *Deleted

## 2014-08-03 ENCOUNTER — Ambulatory Visit (INDEPENDENT_AMBULATORY_CARE_PROVIDER_SITE_OTHER): Payer: Medicare Other | Admitting: Pharmacist Clinician (PhC)/ Clinical Pharmacy Specialist

## 2014-08-03 DIAGNOSIS — I2699 Other pulmonary embolism without acute cor pulmonale: Secondary | ICD-10-CM | POA: Diagnosis not present

## 2014-08-03 DIAGNOSIS — Z7901 Long term (current) use of anticoagulants: Secondary | ICD-10-CM

## 2014-08-03 DIAGNOSIS — I82409 Acute embolism and thrombosis of unspecified deep veins of unspecified lower extremity: Secondary | ICD-10-CM

## 2014-08-03 LAB — POCT INR: INR: 2.9

## 2014-08-30 ENCOUNTER — Telehealth: Payer: Self-pay | Admitting: Pharmacist Clinician (PhC)/ Clinical Pharmacy Specialist

## 2014-08-30 NOTE — Telephone Encounter (Signed)
Reviewed chart, pt has hx of DVT and PE about 5-6 years ago.  Will hold warfarin x 2 days prior to cyst removal.  Advised daughter to give patient 2 mg daily x 2 days post procedure then resume previous dose.  Dtr voiced understanding.

## 2014-09-14 ENCOUNTER — Ambulatory Visit (INDEPENDENT_AMBULATORY_CARE_PROVIDER_SITE_OTHER): Payer: Medicare Other | Admitting: Pharmacist Clinician (PhC)/ Clinical Pharmacy Specialist

## 2014-09-14 DIAGNOSIS — I82409 Acute embolism and thrombosis of unspecified deep veins of unspecified lower extremity: Secondary | ICD-10-CM

## 2014-09-14 DIAGNOSIS — Z7901 Long term (current) use of anticoagulants: Secondary | ICD-10-CM

## 2014-09-14 DIAGNOSIS — I2699 Other pulmonary embolism without acute cor pulmonale: Secondary | ICD-10-CM | POA: Diagnosis not present

## 2014-09-14 LAB — POCT INR: INR: 2.4

## 2014-10-10 ENCOUNTER — Other Ambulatory Visit: Payer: Self-pay | Admitting: Cardiovascular Disease

## 2014-10-26 ENCOUNTER — Ambulatory Visit (INDEPENDENT_AMBULATORY_CARE_PROVIDER_SITE_OTHER): Payer: Medicare Other | Admitting: Pharmacist Clinician (PhC)/ Clinical Pharmacy Specialist

## 2014-10-26 DIAGNOSIS — I2699 Other pulmonary embolism without acute cor pulmonale: Secondary | ICD-10-CM | POA: Diagnosis not present

## 2014-10-26 DIAGNOSIS — I82409 Acute embolism and thrombosis of unspecified deep veins of unspecified lower extremity: Secondary | ICD-10-CM | POA: Diagnosis not present

## 2014-10-26 DIAGNOSIS — Z7901 Long term (current) use of anticoagulants: Secondary | ICD-10-CM | POA: Diagnosis not present

## 2014-10-26 LAB — POCT INR: INR: 2.4

## 2014-11-22 ENCOUNTER — Ambulatory Visit (INDEPENDENT_AMBULATORY_CARE_PROVIDER_SITE_OTHER): Payer: Medicare Other | Admitting: Cardiovascular Disease

## 2014-11-22 ENCOUNTER — Encounter: Payer: Self-pay | Admitting: Cardiovascular Disease

## 2014-11-22 ENCOUNTER — Ambulatory Visit (INDEPENDENT_AMBULATORY_CARE_PROVIDER_SITE_OTHER): Payer: Medicare Other | Admitting: Pharmacist Clinician (PhC)/ Clinical Pharmacy Specialist

## 2014-11-22 VITALS — BP 142/82 | HR 56 | Ht 67.0 in | Wt 182.5 lb

## 2014-11-22 DIAGNOSIS — I10 Essential (primary) hypertension: Secondary | ICD-10-CM

## 2014-11-22 DIAGNOSIS — M25472 Effusion, left ankle: Secondary | ICD-10-CM

## 2014-11-22 DIAGNOSIS — I2699 Other pulmonary embolism without acute cor pulmonale: Secondary | ICD-10-CM | POA: Diagnosis not present

## 2014-11-22 DIAGNOSIS — Z7901 Long term (current) use of anticoagulants: Secondary | ICD-10-CM | POA: Diagnosis not present

## 2014-11-22 DIAGNOSIS — M25473 Effusion, unspecified ankle: Secondary | ICD-10-CM | POA: Insufficient documentation

## 2014-11-22 DIAGNOSIS — I25810 Atherosclerosis of coronary artery bypass graft(s) without angina pectoris: Secondary | ICD-10-CM | POA: Diagnosis not present

## 2014-11-22 DIAGNOSIS — I4510 Unspecified right bundle-branch block: Secondary | ICD-10-CM

## 2014-11-22 DIAGNOSIS — E785 Hyperlipidemia, unspecified: Secondary | ICD-10-CM

## 2014-11-22 DIAGNOSIS — I82409 Acute embolism and thrombosis of unspecified deep veins of unspecified lower extremity: Secondary | ICD-10-CM

## 2014-11-22 LAB — POCT INR: INR: 2.5

## 2014-11-22 MED ORDER — HYDROCHLOROTHIAZIDE 12.5 MG PO CAPS: ORAL_CAPSULE | ORAL | Status: DC

## 2014-11-22 NOTE — Patient Instructions (Signed)
Your physician has recommended you make the following change in your medication: start new fluid pill  Prescription. Take as directed on the bottle. This has already been sent to your pharmacy.  Your physician wants you to follow-up in: 6 months or sooner if needed. You will receive a reminder letter in the mail two months in advance. If you don't receive a letter, please call our office to schedule the follow-up appointment.  If you need a refill on your cardiac medications before your next appointment, please call your pharmacy.

## 2014-11-22 NOTE — Progress Notes (Signed)
Patient ID: Richard Mendez, male   DOB: [birth year], 81 y.o.   MRN: [id number]      HPI: Richard Mendez is a 81 y.o. male who presents to the office for a 6 month cardiology evaluation.  [name] has known CAD and underwent CABG surgery on 02/06/2006 by Dr.Van Tright with a LIMA  to the LAD, a vein to the diagonal, vein to the OM 2, and vein to the PDA. His last cardiac catheterization September 2008 showed that all grafts were patent. He did have left main occlusion and an 80% RCA stenosis involving the PLA vessel. He has a history of a right DVT with subsequent PE and has been on Coumadin anticoagulation.  Additional problems include a history of right bundle branch block, hypertension, as well as hyperlipidemia. He does admit to occasional hip discomfort related to his prior hip replacement surgery in 1997. He admits to ankle swelling, left greater than right.  Last year his dose of Toprol was reduced from  25 mg to 12.5 mg daily when he had complaints of mild dizziness and was bradycardic.  His symptoms did improve.  He denies recent episodes of chest pain.  When I last saw him he was having indigestion symptoms.  At that time, he was having Tums frequently.  I initiated therapy with Protonix 40 mg, which has completely resolved his previous symptomatology.  He is on Coumadin anticoagulation.  His INRs have been therapeutic.  His INR today was 2.5.  Presently, he denies any episodes of chest pain.  He denies shortness of breath.  He is unaware of palpitations.  He denies presyncope or syncope.  He admits to ankle swelling.  He presents for evaluation.  Past Medical History  Diagnosis Date  . Hypertension   . Hyperlipidemia   . RBBB (right bundle branch block)   . CAD (coronary artery disease)     CABG  . Hx of echocardiogram 11/07/2010    showed normal systolic function, grade 2 diastolic dysfunction and also had abnormal tissue doppler suggesting increased LA pressure, left atrium was mildly  dilated, mild to moderate mitral annular calcification with mild MR, moderate tricuspid regurgitation with moderate pulmonary hypertension with estimated RV systolic pressure of 41; trace AR and trace PR  . History of stress test 09/2008    no change from previous, no significant ischemia, and EF of 68%  . S/P CABG (coronary artery bypass graft) 01/2006  . History of DVT (deep vein thrombosis)     right  . History of pulmonary embolism   . Colon cancer [facility]) 1997    surgery     Past Surgical History  Procedure Laterality Date  . Coronary artery bypass graft  02/06/2006    LIMA placed to the LAD, a vein to the diagonal, a vein to the OM2 and a vein to the PDA. ([name]) - diagnostic cath by Dr. Corky Downs on 01/31/2006  . Cardiac catheterization  9/292008    severe native CAD with severe diffuse coronary calcif & total occlusion of L main & diffuse native RCA with 30% prox, 70-80% mid, diffused 50% stenosis before & after the crux, 80% distal RCA stenosis involving PLA; patent vein graft to PDA of RCA, patent vein graft to OM2 of Cfx, patient vein graft supplying diagonal, patent LIMA to mid-distal LAD (Dr. Corky Downs)  . Colonoscopy    . Cholecystectomy  2003  . Hernia repair  2006  . Splenectomy  1944  .  Ankle surgery  1978    ankle fracture  . Transthoracic echocardiogram  10/2010    EF=>55%, LA mildly dilated, RA mildly dilated; mild-mod mitral annular calcif, miild MR, mod TR, mod pulm HTN, RSVP 40-49mH; trace AV regurg, trace pulm valve regurg  . Nm myocar perf wall motion  10/2010    lexiscan; normal patten of perfusion in all regions; post-stress EF 70%, occ PVCs throughout the study, low risk scan     Allergies  Allergen Reactions  . Vesicare [Solifenacin]     Altered mental status  . Morphine And Related Itching and Rash    Current Outpatient Prescriptions  Medication Sig Dispense Refill  . aspirin 81 MG tablet Take 81 mg by mouth daily.    . CRESTOR 10 MG tablet TAKE 1  TABLET BY MOUTH EVERY NIGHT AT BEDTIME 30 tablet 10  . finasteride (PROSCAR) 5 MG tablet Take 1 tablet by mouth daily.    . metoprolol succinate (TOPROL-XL) 25 MG 24 hr tablet TAKE 1/2 TABLET BY MOUTH DAILY 30 tablet 3  . pantoprazole (PROTONIX) 40 MG tablet Take 1 tablet (40 mg total) by mouth daily. 30 tablet 11  . Polyethylene Glycol 3350 (MIRALAX PO) Take by mouth. 1 cup full in the AM    . tamsulosin (FLOMAX) 0.4 MG CAPS Take 1 capsule by mouth daily.    .Marland Kitchenwarfarin (COUMADIN) 1 MG tablet Take 1-2 tablets by mouth daily as directed by coumadin clinic 150 tablet 1   No current facility-administered medications for this visit.    Socially he is married has 4 children 3 grandchildren 4 great-grandchildren. There is no tobacco or alcohol use. He does not routinely exercise.  ROS General: Negative; No fevers, chills, or night sweats;  HEENT: Negative; No changes in vision or hearing, sinus congestion, difficulty swallowing Pulmonary: Negative; No cough, wheezing, shortness of breath, hemoptysis Cardiovascular: Negative; No chest pain, presyncope, syncope, palpitations GI:  Positive for indigestion GU: Negative; No dysuria, hematuria, or difficulty voiding Musculoskeletal: Negative; no myalgias, joint pain, or weakness Hematologic/Oncology: Remote history of splenomegaly at age 81 Endocrine: Negative; no heat/cold intolerance; no diabetes Neuro: Negative; no changes in balance, headaches Skin: Lipoma of his left tricep cyst in the back of his neck; No rashes or skin lesions Psychiatric: Negative; No behavioral problems, depression Sleep: Negative; No snoring, daytime sleepiness, hypersomnolence, bruxism, restless legs, hypnogognic hallucinations, no cataplexy Other comprehensive 14 point system review is negative.   PE BP 142/82 mmHg  Pulse 56  Ht _0  (1.702 m)  Wt 182 lb 8 oz (82.781 kg)  BMI 28.58 kg/m2  Repeat blood pressure by me was 140/72. General: Alert, oriented, no  distress.  Skin: normal turgor, no rashes HEENT: Normocephalic, atraumatic. Pupils round and reactive; sclera anicteric;no lid lag.  Nose without nasal septal hypertrophy Mouth/Parynx benign; Mallinpatti scale 2/3 Neck: No JVD, no carotid bruits, normal carotid upstroke. Probable epidermoid cyst on the back of his neck. Lungs: clear to ausculatation and percussion; no wheezing or rales Chest wall: No tenderness to palpation Heart: RRR, s1 s2 normal 1/6 sem; no diastolic murmur.  No rubs, thrills or heaves. Abdomen: soft, nontender; no hepatosplenomehaly, BS+; abdominal aorta nontender and not dilated by palpation. Back: No CVA tenderness Pulses 2+ Extremities:  Trace - 1+ left ankle edema, trivial on right; no clubbing, cyanosis, Homan's sign negative;  lipoma in the region of his left tricep Neurologic: grossly nonfocal Psychological: Normal affect and mood   ECG (independently read by me): Sinus bradycardia  56 bpm.  First-degree AV block.  Right bundle-branch block.  April 2016 ECG (independently read by me):  Normal sinus rhythm at 60 bpm.  Right bundle branch block with repolarization changes.  First-degree AV block with a PR interval at 248 ms.  October 2015ECG (independently read by me): Sinus bradycardia at 57 beats per minute.  Right bundle branch block with repolarization changes.  First degree AV block with a PR interval 226 ms.  Prior January 2015 ECG( independently interpreted by me): Normal sinus rhythm at 61 beats per minute with right bundle branch block and repolarization changes. Probable left anterior hemiblock. First degree AV block with PR interval at 2:30 milliseconds.  Prior ECG of 08/18/2012: Normal sinus rhythm at 59 beats per minute with first-degree AV block. Right bundle branch block with repolarization changes.  LABS:  BMP Latest Ref Rng 06/15/2014 03/29/2013 07/17/2009  Glucose 70 - 99 mg/dL 135(H) 114(H) 97  BUN 6 - 23 mg/dL _0 Creatinine 0.50 - 1.35  mg/dL 1.31 1.32 0.92  Sodium 135 - 145 mEq/L 139 141 138  Potassium 3.5 - 5.3 mEq/L 4.7 4.7 3.8  Chloride 96 - 112 mEq/L 105 107 108  CO2 19 - 32 mEq/L _1 Calcium 8.4 - 10.5 mg/dL 9.5 9.8 7.9(L)    Hepatic Function Latest Ref Rng 06/15/2014 03/29/2013 07/13/2009  Total Protein 6.0 - 8.3 g/dL 7.2 6.4 7.6  Albumin 3.5 - 5.2 g/dL 3.7 3.6 4.0  AST 0 - 37 U/L _2 ALT 0 - 53 U/L _3 Alk Phosphatase 39 - 117 U/L 53 38(L) 55  Total Bilirubin 0.2 - 1.2 mg/dL 1.0 0.7 1.1  Bilirubin, Direct - - - -    CBC Latest Ref Rng 06/15/2014 03/29/2013 07/17/2009  WBC 4.0 - 10.5 K/uL 8.8 8.9 8.6  Hemoglobin 13.0 - 17.0 g/dL 15.3 14.2 11.7(L)  Hematocrit 39.0 - 52.0 % 45.9 42.0 34.7(L)  Platelets 150 - 400 K/uL 347 320 232   Lab Results  Component Value Date   MCV 99.8 06/15/2014   MCV 98.8 03/29/2013   MCV 103.6* 07/17/2009      BNP    Component Value Date/Time   PROBNP 943.0* 10/19/2006 1617      Lipid Panel     Component Value Date/Time   CHOL 149 06/15/2014 0918   TRIG 149 06/15/2014 0918   HDL 32* 06/15/2014 0918   CHOLHDL 4.7 06/15/2014 0918   VLDL 30 06/15/2014 0918   LDLCALC 87 06/15/2014 0918     RADIOLOGY: No results found.    ASSESSMENT AND PLAN: Mr. Christiansen is an 81 year old male who is almost 9 years status post CABG revascularization surgery. At catheterization in 2008 all grafts were patent.  He is on chronic Coumadin for his history of PE. He's not having any evidence for abnormal bleeding.  His INR today is therapeutic at 2.5.  He is not having any recurrent anginal symptoms.  His blood pressure today is upper normal.  He continues to have sinus bradycardia but is asymptomatic with reference to his previous dizziness and is tolerating his reduced dose of Toprol now at just 12.5 mg daily.  His frequent indigestion symptomatology has completely resolved with Protonix 40 mg daily.  He does have left ankle greater than right ankle swelling.  He states he has  this frequently.  I'm giving him a prescription for HCTZ 12.5 mg to take as needed.  He has gotten his flu shot  this year.  He was inquiring about a pneumonia vaccination.  I discussed with him that since he is status post splenectomy and it has been over 5 years since his last Pneumovax I would recommend that he have this done this year.  He is tolerating Crestor 10 mg for hyperlipidemia.  Recent LDL was 87.  I will see him in 6 months for cardiology reevaluation or sooner if problems arise.   Time spent: 25 minutes  Troy Sine, MD, [facility]  11/22/2014 12:20 PM

## 2015-01-04 ENCOUNTER — Other Ambulatory Visit: Payer: Self-pay | Admitting: Cardiovascular Disease

## 2015-01-04 ENCOUNTER — Ambulatory Visit (INDEPENDENT_AMBULATORY_CARE_PROVIDER_SITE_OTHER): Payer: Medicare Other | Admitting: Pharmacist Clinician (PhC)/ Clinical Pharmacy Specialist

## 2015-01-04 DIAGNOSIS — I82409 Acute embolism and thrombosis of unspecified deep veins of unspecified lower extremity: Secondary | ICD-10-CM | POA: Diagnosis not present

## 2015-01-04 DIAGNOSIS — Z7901 Long term (current) use of anticoagulants: Secondary | ICD-10-CM

## 2015-01-04 DIAGNOSIS — I2699 Other pulmonary embolism without acute cor pulmonale: Secondary | ICD-10-CM

## 2015-01-04 LAB — POCT INR: INR: 2.3

## 2015-01-04 NOTE — Telephone Encounter (Signed)
Rx has been sent to the pharmacy electronically. ° °

## 2015-01-24 ENCOUNTER — Other Ambulatory Visit: Payer: Self-pay | Admitting: Cardiovascular Disease

## 2015-01-24 NOTE — Telephone Encounter (Signed)
Rx request sent to pharmacy.  

## 2015-02-15 ENCOUNTER — Ambulatory Visit (INDEPENDENT_AMBULATORY_CARE_PROVIDER_SITE_OTHER): Payer: Medicare Other | Admitting: Pharmacist Clinician (PhC)/ Clinical Pharmacy Specialist

## 2015-02-15 DIAGNOSIS — I2699 Other pulmonary embolism without acute cor pulmonale: Secondary | ICD-10-CM

## 2015-02-15 DIAGNOSIS — I82409 Acute embolism and thrombosis of unspecified deep veins of unspecified lower extremity: Secondary | ICD-10-CM | POA: Diagnosis not present

## 2015-02-15 DIAGNOSIS — Z7901 Long term (current) use of anticoagulants: Secondary | ICD-10-CM

## 2015-02-15 LAB — POCT INR: INR: 3.1

## 2015-02-23 ENCOUNTER — Other Ambulatory Visit: Payer: Self-pay | Admitting: Cardiovascular Disease

## 2015-03-22 ENCOUNTER — Other Ambulatory Visit: Payer: Self-pay | Admitting: Cardiovascular Disease

## 2015-03-22 NOTE — Telephone Encounter (Signed)
Rx(s) sent to pharmacy electronically.  

## 2015-03-31 ENCOUNTER — Ambulatory Visit (INDEPENDENT_AMBULATORY_CARE_PROVIDER_SITE_OTHER): Payer: Medicare Other | Admitting: Pharmacist Clinician (PhC)/ Clinical Pharmacy Specialist

## 2015-03-31 DIAGNOSIS — Z7901 Long term (current) use of anticoagulants: Secondary | ICD-10-CM | POA: Diagnosis not present

## 2015-03-31 DIAGNOSIS — I2699 Other pulmonary embolism without acute cor pulmonale: Secondary | ICD-10-CM

## 2015-03-31 DIAGNOSIS — I82409 Acute embolism and thrombosis of unspecified deep veins of unspecified lower extremity: Secondary | ICD-10-CM

## 2015-03-31 LAB — POCT INR: INR: 2.5

## 2015-05-03 ENCOUNTER — Other Ambulatory Visit: Payer: Self-pay | Admitting: Cardiovascular Disease

## 2015-05-03 NOTE — Telephone Encounter (Signed)
Rx(s) sent to pharmacy electronically.  

## 2015-05-12 ENCOUNTER — Ambulatory Visit (INDEPENDENT_AMBULATORY_CARE_PROVIDER_SITE_OTHER): Payer: Medicare Other | Admitting: Pharmacist Clinician (PhC)/ Clinical Pharmacy Specialist

## 2015-05-12 DIAGNOSIS — Z7901 Long term (current) use of anticoagulants: Secondary | ICD-10-CM

## 2015-05-12 DIAGNOSIS — I2699 Other pulmonary embolism without acute cor pulmonale: Secondary | ICD-10-CM

## 2015-05-12 DIAGNOSIS — I82409 Acute embolism and thrombosis of unspecified deep veins of unspecified lower extremity: Secondary | ICD-10-CM

## 2015-05-12 LAB — POCT INR: INR: 2.8

## 2015-05-18 ENCOUNTER — Other Ambulatory Visit: Payer: Self-pay | Admitting: Cardiovascular Disease

## 2015-05-18 NOTE — Telephone Encounter (Signed)
REFILL 

## 2015-06-13 ENCOUNTER — Encounter: Payer: Self-pay | Admitting: Cardiovascular Disease

## 2015-06-13 ENCOUNTER — Ambulatory Visit (INDEPENDENT_AMBULATORY_CARE_PROVIDER_SITE_OTHER): Payer: Medicare Other | Admitting: Cardiovascular Disease

## 2015-06-13 ENCOUNTER — Ambulatory Visit (INDEPENDENT_AMBULATORY_CARE_PROVIDER_SITE_OTHER): Payer: Medicare Other | Admitting: Pharmacist

## 2015-06-13 VITALS — BP 130/64 | HR 57 | Ht 67.0 in | Wt 163.0 lb

## 2015-06-13 DIAGNOSIS — Z7901 Long term (current) use of anticoagulants: Secondary | ICD-10-CM | POA: Diagnosis not present

## 2015-06-13 DIAGNOSIS — I2699 Other pulmonary embolism without acute cor pulmonale: Secondary | ICD-10-CM | POA: Diagnosis not present

## 2015-06-13 DIAGNOSIS — K219 Gastro-esophageal reflux disease without esophagitis: Secondary | ICD-10-CM

## 2015-06-13 DIAGNOSIS — I10 Essential (primary) hypertension: Secondary | ICD-10-CM

## 2015-06-13 DIAGNOSIS — I2510 Atherosclerotic heart disease of native coronary artery without angina pectoris: Secondary | ICD-10-CM | POA: Diagnosis not present

## 2015-06-13 DIAGNOSIS — E785 Hyperlipidemia, unspecified: Secondary | ICD-10-CM | POA: Diagnosis not present

## 2015-06-13 DIAGNOSIS — I82409 Acute embolism and thrombosis of unspecified deep veins of unspecified lower extremity: Secondary | ICD-10-CM | POA: Diagnosis not present

## 2015-06-13 DIAGNOSIS — Z79899 Other long term (current) drug therapy: Secondary | ICD-10-CM | POA: Diagnosis not present

## 2015-06-13 DIAGNOSIS — I4510 Unspecified right bundle-branch block: Secondary | ICD-10-CM

## 2015-06-13 DIAGNOSIS — M25473 Effusion, unspecified ankle: Secondary | ICD-10-CM

## 2015-06-13 LAB — POCT INR: INR: 3.5

## 2015-06-13 NOTE — Patient Instructions (Signed)
Your physician recommends that you return for lab work in.  Your physician wants you to follow-up in: 6 months or sooner if needed. You will receive a reminder letter in the mail two months in advance. If you don't receive a letter, please call our office to schedule the follow-up appointment.   If you need a refill on your cardiac medications before your next appointment, please call your pharmacy.

## 2015-06-15 ENCOUNTER — Other Ambulatory Visit: Payer: Self-pay | Admitting: Cardiovascular Disease

## 2015-06-15 ENCOUNTER — Encounter: Payer: Self-pay | Admitting: Cardiovascular Disease

## 2015-06-15 DIAGNOSIS — K219 Gastro-esophageal reflux disease without esophagitis: Secondary | ICD-10-CM | POA: Insufficient documentation

## 2015-06-15 NOTE — Progress Notes (Signed)
Patient ID: Richard Mendez, male   DOB: 1925-08-23, 82 y.o.   MRN: [id number]      HPI: Richard Mendez is a 82 y.o. male who presents to the office for a 6 month cardiology evaluation.  [name] has known CAD and underwent CABG surgery on 02/06/2006 by Dr.Van Tright with a LIMA  to the LAD, a vein to the diagonal, vein to the OM 2, and vein to the PDA. His last cardiac catheterization September 2008 showed that all grafts were patent. He did have left main occlusion and an 80% RCA stenosis involving the PLA vessel. He has a history of a right DVT with subsequent PE and has been on Coumadin anticoagulation.  Additional problems include a history of right bundle branch block, hypertension, and hyperlipidemia. He has occasional hip discomfort related to his prior hip replacement surgery in 1997. He admits to ankle swelling, left greater than right.  In 2015  his dose of Toprol was reduced from  25 mg to 12.5 mg daily when he had complaints of mild dizziness and was bradycardic.  His symptoms did improve.  He denies recent episodes of chest pain.  When I last saw him he was having indigestion symptoms.  At that time, he was having Tums frequently.  I initiated therapy with Protonix 40 mg, which has completely resolved his previous symptomatology.  He is on Coumadin anticoagulation.  His INRs have been therapeutic.  His INR today was 2.5.  Since I last saw him, he denies any episodes of chest pain, shortness of breath, or exertional abnormalities.  He has noticed slight increased gas, which seems to improve with belching.   He is unaware of palpitations.  He denies presyncope or syncope.  He admits to ankle swelling.  His INR when last checked was 2.8 on 05/12/2015; otherwise, he has has not had recent lab work done in a year.  He presents for evaluation.  Past Medical History  Diagnosis Date  . Hypertension   . Hyperlipidemia   . RBBB (right bundle branch block)   . CAD (coronary artery disease)     CABG    . Hx of echocardiogram 11/07/2010    showed normal systolic function, grade 2 diastolic dysfunction and also had abnormal tissue doppler suggesting increased LA pressure, left atrium was mildly dilated, mild to moderate mitral annular calcification with mild MR, moderate tricuspid regurgitation with moderate pulmonary hypertension with estimated RV systolic pressure of 41; trace AR and trace PR  . History of stress test 09/2008    no change from previous, no significant ischemia, and EF of 68%  . S/P CABG (coronary artery bypass graft) 01/2006  . History of DVT (deep vein thrombosis)     right  . History of pulmonary embolism   . Colon cancer [facility]) 1997    surgery     Past Surgical History  Procedure Laterality Date  . Coronary artery bypass graft  02/06/2006    LIMA placed to the LAD, a vein to the diagonal, a vein to the OM2 and a vein to the PDA. ([name]) - diagnostic cath by Dr. Corky Downs on 01/31/2006  . Cardiac catheterization  9/292008    severe native CAD with severe diffuse coronary calcif & total occlusion of L main & diffuse native RCA with 30% prox, 70-80% mid, diffused 50% stenosis before & after the crux, 80% distal RCA stenosis involving PLA; patent vein graft to PDA of RCA, patent vein graft to  OM2 of Cfx, patient vein graft supplying diagonal, patent LIMA to mid-distal LAD (Dr. Corky Downs)  . Colonoscopy    . Cholecystectomy  2003  . Hernia repair  2006  . Splenectomy  1944  . Ankle surgery  1978    ankle fracture  . Transthoracic echocardiogram  10/2010    EF=>55%, LA mildly dilated, RA mildly dilated; mild-mod mitral annular calcif, miild MR, mod TR, mod pulm HTN, RSVP 40-73mH; trace AV regurg, trace pulm valve regurg  . Nm myocar perf wall motion  10/2010    lexiscan; normal patten of perfusion in all regions; post-stress EF 70%, occ PVCs throughout the study, low risk scan     Allergies  Allergen Reactions  . Vesicare [Solifenacin]     Altered mental status   . Morphine And Related Itching and Rash    Current Outpatient Prescriptions  Medication Sig Dispense Refill  . aspirin 81 MG tablet Take 81 mg by mouth daily.    . CRESTOR 10 MG tablet TAKE 1 TABLET BY MOUTH EVERY NIGHT AT BEDTIME 30 tablet 4  . finasteride (PROSCAR) 5 MG tablet Take 1 tablet by mouth daily.    . hydrochlorothiazide (MICROZIDE) 12.5 MG capsule TAKE 1 CAPSULE(12.5 MG) BY MOUTH DAILY 30 capsule 0  . metoprolol succinate (TOPROL-XL) 25 MG 24 hr tablet TAKE 1/2 TABLET BY MOUTH DAILY 30 tablet 6  . pantoprazole (PROTONIX) 40 MG tablet TAKE 1 TABLET(40 MG) BY MOUTH DAILY 30 tablet 6  . Polyethylene Glycol 3350 (MIRALAX PO) Take by mouth. 1 cup full in the AM    . tamsulosin (FLOMAX) 0.4 MG CAPS Take 1 capsule by mouth daily.    .Marland Kitchenwarfarin (COUMADIN) 1 MG tablet TAKE 1 TO 2 TABLETS BY MOUTH DAILY AS DIRECTED BY COUMADIN CLINIC 150 tablet 1   No current facility-administered medications for this visit.    Socially he is married has 4 children 3 grandchildren 4 great-grandchildren. There is no tobacco or alcohol use. He does not routinely exercise.  ROS General: Negative; No fevers, chills, or night sweats;  HEENT: Negative; No changes in vision or hearing, sinus congestion, difficulty swallowing Pulmonary: Negative; No cough, wheezing, shortness of breath, hemoptysis Cardiovascular: Negative; No chest pain, presyncope, syncope, palpitations GI:  Positive for indigestion GU: Negative; No dysuria, hematuria, or difficulty voiding Musculoskeletal: Negative; no myalgias, joint pain, or weakness Hematologic/Oncology: Remote history of splenomegaly at age 82 Endocrine: Negative; no heat/cold intolerance; no diabetes Neuro: Negative; no changes in balance, headaches Skin: Lipoma of his left tricep cyst in the back of his neck; No rashes or skin lesions Psychiatric: Negative; No behavioral problems, depression Sleep: Negative; No snoring, daytime sleepiness, hypersomnolence,  bruxism, restless legs, hypnogognic hallucinations, no cataplexy Other comprehensive 14 point system review is negative.   PE BP 130/64 mmHg  Pulse 57  Ht '5\' 7"'  (1.702 m)  Wt 163 lb (73.936 kg)  BMI 25.52 kg/m2   Repeat blood pressure by me was 128/70.  Wt Readings from Last 3 Encounters:  06/13/15 163 lb (73.936 kg)  11/22/14 182 lb 8 oz (82.781 kg)  05/12/14 180 lb 8 oz (81.874 kg)   General: Alert, oriented, no distress.  Skin: normal turgor, no rashes HEENT: Normocephalic, atraumatic. Pupils round and reactive; sclera anicteric;no lid lag.  Nose without nasal septal hypertrophy Mouth/Parynx benign; Mallinpatti scale 2/3 Neck: No JVD, no carotid bruits, normal carotid upstroke. Probable epidermoid cyst on the back of his neck. Lungs: clear to ausculatation and percussion; no wheezing or  rales Chest wall: No tenderness to palpation Heart: RRR, s1 s2 normal 1/6 sem; no diastolic murmur.  No rubs, thrills or heaves. Abdomen: soft, nontender; no hepatosplenomehaly, BS+; abdominal aorta nontender and not dilated by palpation. Back: No CVA tenderness Pulses 2+ Extremities:  Trace ankle swelling; no clubbing, cyanosis, Homan's sign negative;  lipoma in the region of his left tricep Neurologic: grossly nonfocal Psychological: Normal affect and mood   ECG (independently read by me): Sinus bradycardia 57 bpm.  First-degree AV block with a PR interval 250 ms.  Right bundle-branch block with repolarization changes.  November 2016 ECG (independently read by me): Sinus bradycardia 56 bpm.  First-degree AV block.  Right bundle-branch block.  April 2016 ECG (independently read by me):  Normal sinus rhythm at 60 bpm.  Right bundle branch block with repolarization changes.  First-degree AV block with a PR interval at 248 ms.  October 2015ECG (independently read by me): Sinus bradycardia at 57 beats per minute.  Right bundle branch block with repolarization changes.  First degree AV block  with a PR interval 226 ms.  Prior January 2015 ECG( independently interpreted by me): Normal sinus rhythm at 61 beats per minute with right bundle branch block and repolarization changes. Probable left anterior hemiblock. First degree AV block with PR interval at 2:30 milliseconds.  Prior ECG of 08/18/2012: Normal sinus rhythm at 59 beats per minute with first-degree AV block. Right bundle branch block with repolarization changes.  LABS:  BMP Latest Ref Rng 06/15/2014 03/29/2013 07/17/2009  Glucose 70 - 99 mg/dL 135(H) 114(H) 97  BUN 6 - 23 mg/dL '22 23 13  ' Creatinine 0.50 - 1.35 mg/dL 1.31 1.32 0.92  Sodium 135 - 145 mEq/L 139 141 138  Potassium 3.5 - 5.3 mEq/L 4.7 4.7 3.8  Chloride 96 - 112 mEq/L 105 107 108  CO2 19 - 32 mEq/L '23 27 23  ' Calcium 8.4 - 10.5 mg/dL 9.5 9.8 7.9(L)    Hepatic Function Latest Ref Rng 06/15/2014 03/29/2013 07/13/2009  Total Protein 6.0 - 8.3 g/dL 7.2 6.4 7.6  Albumin 3.5 - 5.2 g/dL 3.7 3.6 4.0  AST 0 - 37 U/L '16 16 28  ' ALT 0 - 53 U/L '13 11 19  ' Alk Phosphatase 39 - 117 U/L 53 38(L) 55  Total Bilirubin 0.2 - 1.2 mg/dL 1.0 0.7 1.1  Bilirubin, Direct - - - -    CBC Latest Ref Rng 06/15/2014 03/29/2013 07/17/2009  WBC 4.0 - 10.5 K/uL 8.8 8.9 8.6  Hemoglobin 13.0 - 17.0 g/dL 15.3 14.2 11.7(L)  Hematocrit 39.0 - 52.0 % 45.9 42.0 34.7(L)  Platelets 150 - 400 K/uL 347 320 232   Lab Results  Component Value Date   MCV 99.8 06/15/2014   MCV 98.8 03/29/2013   MCV 103.6* 07/17/2009      BNP    Component Value Date/Time   PROBNP 943.0* 10/19/2006 1617      Lipid Panel     Component Value Date/Time   CHOL 149 06/15/2014 0918   TRIG 149 06/15/2014 0918   HDL 32* 06/15/2014 0918   CHOLHDL 4.7 06/15/2014 0918   VLDL 30 06/15/2014 0918   LDLCALC 87 06/15/2014 0918     RADIOLOGY: No results found.    ASSESSMENT AND PLAN: Mr. Mercier is an 82 year old male who is 91/2  years status post CABG revascularization surgery. At catheterization in 2008 all grafts  were patent.  He is on chronic Coumadin for his history of PE. He's not having any evidence for  abnormal bleeding.  His INR today when last checked was therapeutic at 2.8.  He is not having any recurrent anginal symptoms.  His blood pressure today is stable on his regimen consisting of Toprol-XL 12.5 mg and HCTZ 12.5 mg daily.  He has sinus bradycardia with right bundle branch block and first-degree AV block but this is stable on low-dose beta blocker therapy with Toprol-XL 12.5 mg.  He denies any presyncope.  He's not having any significant edema.  He continues to be on Crestor 10 mg daily for hyperlipidemia with target LDL less than 70.  He has not had recent blood work.  The past he had significant GERD but this is controlled with pantoprazole 40 mg daily.  He does note occasional abdominal bloating which improves with belching.  There may be worthwhile to consider over-the-counter simethicone.  A complete set of fasting lab work will be obtained.  I will contact him regarding the results.  I will see him in 6 months for reevaluation.  Time spent: 25 minutes Troy Sine, MD, [facility]  06/15/2015 1:59 PM

## 2015-06-16 NOTE — Telephone Encounter (Signed)
REFILL 

## 2015-06-23 ENCOUNTER — Ambulatory Visit: Payer: Medicare Other | Admitting: Cardiovascular Disease

## 2015-06-28 LAB — CBC
HCT: 43.2 % (ref 38.5–50.0)
Hemoglobin: 14.5 g/dL (ref 13.2–17.1)
MCH: 33 pg (ref 27.0–33.0)
MCHC: 33.6 g/dL (ref 32.0–36.0)
MCV: 98.2 fL (ref 80.0–100.0)
MPV: 10.2 fL (ref 7.5–12.5)
PLATELETS: 346 10*3/uL (ref 140–400)
RBC: 4.4 MIL/uL (ref 4.20–5.80)
RDW: 14.5 % (ref 11.0–15.0)
WBC: 9.1 10*3/uL (ref 3.8–10.8)

## 2015-06-29 LAB — COMPREHENSIVE METABOLIC PANEL
ALK PHOS: 43 U/L (ref 40–115)
ALT: 14 U/L (ref 9–46)
AST: 22 U/L (ref 10–35)
Albumin: 3.7 g/dL (ref 3.6–5.1)
BUN: 19 mg/dL (ref 7–25)
CHLORIDE: 107 mmol/L (ref 98–110)
CO2: 22 mmol/L (ref 20–31)
Calcium: 9 mg/dL (ref 8.6–10.3)
Creat: 1.31 mg/dL — ABNORMAL HIGH (ref 0.70–1.11)
GLUCOSE: 124 mg/dL — AB (ref 65–99)
POTASSIUM: 4.2 mmol/L (ref 3.5–5.3)
Sodium: 138 mmol/L (ref 135–146)
Total Bilirubin: 1.1 mg/dL (ref 0.2–1.2)
Total Protein: 6.5 g/dL (ref 6.1–8.1)

## 2015-06-29 LAB — LIPID PANEL
CHOL/HDL RATIO: 3.8 ratio (ref <=5.0)
Cholesterol: 123 mg/dL — ABNORMAL LOW (ref 125–200)
HDL: 32 mg/dL — ABNORMAL LOW (ref >=40)
LDL Cholesterol: 61 mg/dL (ref <130)
Triglycerides: 148 mg/dL (ref <150)
VLDL: 30 mg/dL (ref <30)

## 2015-06-29 LAB — TSH: TSH: 1.71 m[IU]/L (ref 0.40–4.50)

## 2015-07-05 ENCOUNTER — Encounter: Payer: Self-pay | Admitting: *Deleted

## 2015-07-11 ENCOUNTER — Ambulatory Visit (INDEPENDENT_AMBULATORY_CARE_PROVIDER_SITE_OTHER): Payer: Medicare Other | Admitting: Pharmacist Clinician (PhC)/ Clinical Pharmacy Specialist

## 2015-07-11 DIAGNOSIS — Z7901 Long term (current) use of anticoagulants: Secondary | ICD-10-CM

## 2015-07-11 DIAGNOSIS — I82409 Acute embolism and thrombosis of unspecified deep veins of unspecified lower extremity: Secondary | ICD-10-CM

## 2015-07-11 DIAGNOSIS — I2699 Other pulmonary embolism without acute cor pulmonale: Secondary | ICD-10-CM

## 2015-07-11 LAB — POCT INR: INR: 2.8

## 2015-07-12 ENCOUNTER — Other Ambulatory Visit: Payer: Self-pay | Admitting: Cardiovascular Disease

## 2015-07-12 NOTE — Telephone Encounter (Signed)
Follow up       *STAT* If patient is at the pharmacy, call can be transferred to refill team.   1. Which medications need to be refilled? (please list name of each medication and dose if known)  HCTZ 12.5mg  cap 2. Which pharmacy/location (including street and city if local pharmacy) is medication to be sent to? [facility] [phone]  3. Do they need a 30 day or 90 day supply? 30 day

## 2015-07-13 ENCOUNTER — Telehealth: Payer: Self-pay | Admitting: Cardiovascular Disease

## 2015-07-13 MED ORDER — HYDROCHLOROTHIAZIDE 12.5 MG PO CAPS: 12.5000 mg | ORAL_CAPSULE | Freq: Every day | ORAL | Status: DC

## 2015-07-13 NOTE — Telephone Encounter (Signed)
New message    *STAT* If patient is at the pharmacy, call can be transferred to refill team.   1. Which medications need to be refilled? (please list name of each medication and dose if known) Hydrochlorothiazide 12.5 mg po daily  2. Which pharmacy/location (including street and city if local pharmacy) is medication to be sent to?[facility]  3. Do they need a 30 day or 90 day supply? 30 day

## 2015-07-13 NOTE — Telephone Encounter (Signed)
Rx(s) sent to pharmacy electronically.  

## 2015-08-04 ENCOUNTER — Other Ambulatory Visit: Payer: Self-pay | Admitting: Cardiovascular Disease

## 2015-08-04 NOTE — Telephone Encounter (Signed)
Rx has been sent to the pharmacy electronically. ° °

## 2015-08-23 ENCOUNTER — Ambulatory Visit (INDEPENDENT_AMBULATORY_CARE_PROVIDER_SITE_OTHER): Payer: Medicare Other | Admitting: Pharmacist Clinician (PhC)/ Clinical Pharmacy Specialist

## 2015-08-23 DIAGNOSIS — I2699 Other pulmonary embolism without acute cor pulmonale: Secondary | ICD-10-CM

## 2015-08-23 DIAGNOSIS — I82409 Acute embolism and thrombosis of unspecified deep veins of unspecified lower extremity: Secondary | ICD-10-CM

## 2015-08-23 DIAGNOSIS — Z7901 Long term (current) use of anticoagulants: Secondary | ICD-10-CM

## 2015-08-23 LAB — POCT INR: INR: 2.3

## 2015-10-06 ENCOUNTER — Ambulatory Visit (INDEPENDENT_AMBULATORY_CARE_PROVIDER_SITE_OTHER): Payer: Medicare Other | Admitting: Pharmacist Clinician (PhC)/ Clinical Pharmacy Specialist

## 2015-10-06 DIAGNOSIS — Z7901 Long term (current) use of anticoagulants: Secondary | ICD-10-CM

## 2015-10-06 DIAGNOSIS — I82409 Acute embolism and thrombosis of unspecified deep veins of unspecified lower extremity: Secondary | ICD-10-CM

## 2015-10-06 DIAGNOSIS — I2699 Other pulmonary embolism without acute cor pulmonale: Secondary | ICD-10-CM | POA: Diagnosis not present

## 2015-10-06 LAB — POCT INR: INR: 2.5

## 2015-10-13 ENCOUNTER — Other Ambulatory Visit: Payer: Self-pay | Admitting: Cardiovascular Disease

## 2015-11-17 ENCOUNTER — Ambulatory Visit (INDEPENDENT_AMBULATORY_CARE_PROVIDER_SITE_OTHER): Payer: Medicare Other | Admitting: Pharmacist Clinician (PhC)/ Clinical Pharmacy Specialist

## 2015-11-17 DIAGNOSIS — I82409 Acute embolism and thrombosis of unspecified deep veins of unspecified lower extremity: Secondary | ICD-10-CM | POA: Diagnosis not present

## 2015-11-17 DIAGNOSIS — I2699 Other pulmonary embolism without acute cor pulmonale: Secondary | ICD-10-CM

## 2015-11-17 DIAGNOSIS — Z7901 Long term (current) use of anticoagulants: Secondary | ICD-10-CM

## 2015-11-17 LAB — POCT INR: INR: 2.7

## 2015-12-04 ENCOUNTER — Other Ambulatory Visit: Payer: Self-pay | Admitting: Cardiovascular Disease

## 2015-12-29 ENCOUNTER — Ambulatory Visit (INDEPENDENT_AMBULATORY_CARE_PROVIDER_SITE_OTHER): Payer: Medicare Other | Admitting: Pharmacist

## 2015-12-29 DIAGNOSIS — I2699 Other pulmonary embolism without acute cor pulmonale: Secondary | ICD-10-CM | POA: Diagnosis not present

## 2015-12-29 DIAGNOSIS — Z7901 Long term (current) use of anticoagulants: Secondary | ICD-10-CM

## 2015-12-29 DIAGNOSIS — I82409 Acute embolism and thrombosis of unspecified deep veins of unspecified lower extremity: Secondary | ICD-10-CM

## 2015-12-29 LAB — POCT INR: INR: 2.8

## 2016-01-11 ENCOUNTER — Other Ambulatory Visit: Payer: Self-pay | Admitting: Cardiovascular Disease

## 2016-02-08 ENCOUNTER — Other Ambulatory Visit: Payer: Self-pay | Admitting: Cardiovascular Disease

## 2016-02-09 ENCOUNTER — Ambulatory Visit (INDEPENDENT_AMBULATORY_CARE_PROVIDER_SITE_OTHER): Payer: Medicare Other | Admitting: Pharmacist Clinician (PhC)/ Clinical Pharmacy Specialist

## 2016-02-09 DIAGNOSIS — I82409 Acute embolism and thrombosis of unspecified deep veins of unspecified lower extremity: Secondary | ICD-10-CM

## 2016-02-09 DIAGNOSIS — Z7901 Long term (current) use of anticoagulants: Secondary | ICD-10-CM

## 2016-02-09 DIAGNOSIS — I2699 Other pulmonary embolism without acute cor pulmonale: Secondary | ICD-10-CM

## 2016-02-09 LAB — POCT INR: INR: 2.5

## 2016-03-05 ENCOUNTER — Other Ambulatory Visit: Payer: Self-pay | Admitting: Cardiovascular Disease

## 2016-03-22 ENCOUNTER — Ambulatory Visit (INDEPENDENT_AMBULATORY_CARE_PROVIDER_SITE_OTHER): Payer: Medicare Other | Admitting: Pharmacist Clinician (PhC)/ Clinical Pharmacy Specialist

## 2016-03-22 DIAGNOSIS — I82409 Acute embolism and thrombosis of unspecified deep veins of unspecified lower extremity: Secondary | ICD-10-CM | POA: Diagnosis not present

## 2016-03-22 DIAGNOSIS — I2699 Other pulmonary embolism without acute cor pulmonale: Secondary | ICD-10-CM | POA: Diagnosis not present

## 2016-03-22 DIAGNOSIS — Z7901 Long term (current) use of anticoagulants: Secondary | ICD-10-CM

## 2016-03-22 LAB — POCT INR: INR: 3.1

## 2016-04-12 ENCOUNTER — Telehealth: Payer: Self-pay | Admitting: Cardiovascular Disease

## 2016-04-12 NOTE — Telephone Encounter (Signed)
Pt is scheduled to see Dr Claiborne Billings on 05-02-16.Does he need lab work before his appointment?

## 2016-04-15 ENCOUNTER — Other Ambulatory Visit: Payer: Self-pay | Admitting: *Deleted

## 2016-04-15 DIAGNOSIS — Z79899 Other long term (current) drug therapy: Secondary | ICD-10-CM

## 2016-04-15 DIAGNOSIS — E785 Hyperlipidemia, unspecified: Secondary | ICD-10-CM

## 2016-04-15 DIAGNOSIS — I10 Essential (primary) hypertension: Secondary | ICD-10-CM

## 2016-04-15 DIAGNOSIS — I2510 Atherosclerotic heart disease of native coronary artery without angina pectoris: Secondary | ICD-10-CM

## 2016-04-15 NOTE — Telephone Encounter (Signed)
Daughter notified labs will be needed. Orders left at the front desk for her to pick up.

## 2016-04-25 ENCOUNTER — Other Ambulatory Visit: Payer: Self-pay | Admitting: Cardiovascular Disease

## 2016-04-25 ENCOUNTER — Telehealth: Payer: Self-pay | Admitting: Pharmacist Clinician (PhC)/ Clinical Pharmacy Specialist

## 2016-04-25 NOTE — Telephone Encounter (Signed)
Patient daughter Asa Lente) calling and states that she would like some advice. Thanks.

## 2016-04-25 NOTE — Telephone Encounter (Signed)
Family asking about optomotrist.

## 2016-04-29 LAB — CBC
HCT: 44.4 % (ref 38.5–50.0)
Hemoglobin: 15.3 g/dL (ref 13.2–17.1)
MCH: 34.3 pg — ABNORMAL HIGH (ref 27.0–33.0)
MCHC: 34.5 g/dL (ref 32.0–36.0)
MCV: 99.6 fL (ref 80.0–100.0)
MPV: 9.9 fL (ref 7.5–12.5)
Platelets: 348 10*3/uL (ref 140–400)
RBC: 4.46 MIL/uL (ref 4.20–5.80)
RDW: 14.3 % (ref 11.0–15.0)
WBC: 9.4 10*3/uL (ref 3.8–10.8)

## 2016-04-30 LAB — TSH: TSH: 2.51 mIU/L (ref 0.40–4.50)

## 2016-04-30 LAB — COMPREHENSIVE METABOLIC PANEL
ALBUMIN: 3.8 g/dL (ref 3.6–5.1)
ALT: 11 U/L (ref 9–46)
AST: 17 U/L (ref 10–35)
Alkaline Phosphatase: 43 U/L (ref 40–115)
BILIRUBIN TOTAL: 1.2 mg/dL (ref 0.2–1.2)
BUN: 18 mg/dL (ref 7–25)
CHLORIDE: 104 mmol/L (ref 98–110)
CO2: 27 mmol/L (ref 20–31)
CREATININE: 1.19 mg/dL — AB (ref 0.70–1.11)
Calcium: 9 mg/dL (ref 8.6–10.3)
GLUCOSE: 110 mg/dL — AB (ref 65–99)
Potassium: 4.4 mmol/L (ref 3.5–5.3)
Sodium: 138 mmol/L (ref 135–146)
Total Protein: 6.9 g/dL (ref 6.1–8.1)

## 2016-04-30 LAB — LIPID PANEL
CHOLESTEROL: 138 mg/dL (ref <200)
HDL: 33 mg/dL — AB (ref >40)
LDL Cholesterol: 72 mg/dL (ref <100)
TRIGLYCERIDES: 166 mg/dL — AB (ref <150)
Total CHOL/HDL Ratio: 4.2 Ratio (ref <5.0)
VLDL: 33 mg/dL — ABNORMAL HIGH (ref <30)

## 2016-05-03 ENCOUNTER — Ambulatory Visit (INDEPENDENT_AMBULATORY_CARE_PROVIDER_SITE_OTHER): Payer: Medicare Other | Admitting: Pharmacist Clinician (PhC)/ Clinical Pharmacy Specialist

## 2016-05-03 ENCOUNTER — Encounter: Payer: Self-pay | Admitting: Cardiovascular Disease

## 2016-05-03 ENCOUNTER — Ambulatory Visit (INDEPENDENT_AMBULATORY_CARE_PROVIDER_SITE_OTHER): Payer: Medicare Other | Admitting: Cardiovascular Disease

## 2016-05-03 VITALS — BP 112/80 | HR 64 | Ht 67.0 in | Wt 183.0 lb

## 2016-05-03 DIAGNOSIS — Z7901 Long term (current) use of anticoagulants: Secondary | ICD-10-CM

## 2016-05-03 DIAGNOSIS — I4510 Unspecified right bundle-branch block: Secondary | ICD-10-CM

## 2016-05-03 DIAGNOSIS — I10 Essential (primary) hypertension: Secondary | ICD-10-CM | POA: Diagnosis not present

## 2016-05-03 DIAGNOSIS — I2699 Other pulmonary embolism without acute cor pulmonale: Secondary | ICD-10-CM | POA: Diagnosis not present

## 2016-05-03 DIAGNOSIS — Z79899 Other long term (current) drug therapy: Secondary | ICD-10-CM | POA: Diagnosis not present

## 2016-05-03 DIAGNOSIS — I82409 Acute embolism and thrombosis of unspecified deep veins of unspecified lower extremity: Secondary | ICD-10-CM | POA: Diagnosis not present

## 2016-05-03 DIAGNOSIS — I2510 Atherosclerotic heart disease of native coronary artery without angina pectoris: Secondary | ICD-10-CM

## 2016-05-03 DIAGNOSIS — E785 Hyperlipidemia, unspecified: Secondary | ICD-10-CM | POA: Diagnosis not present

## 2016-05-03 DIAGNOSIS — Z951 Presence of aortocoronary bypass graft: Secondary | ICD-10-CM

## 2016-05-03 DIAGNOSIS — K219 Gastro-esophageal reflux disease without esophagitis: Secondary | ICD-10-CM

## 2016-05-03 LAB — POCT INR: INR: 4.4

## 2016-05-03 MED ORDER — ROSUVASTATIN CALCIUM 20 MG PO TABS: 20.0000 mg | ORAL_TABLET | Freq: Every day | ORAL | 6 refills | Status: DC

## 2016-05-03 NOTE — Progress Notes (Signed)
Patient ID: BRASEN BUNDREN, male   DOB: 07-29-1925, 83 y.o.   MRN: [id number]      HPI: JORRELL KUSTER is a 83 y.o. male who presents to the office for an 19 month cardiology evaluation.  Mr. Ayotte has known CAD and underwent CABG surgery on 02/06/2006 by Dr.Van Tright with a LIMA  to the LAD, a vein to the diagonal, vein to the OM 2, and vein to the PDA. His last cardiac catheterization September 2008 showed that all grafts were patent. He did have left main occlusion and an 80% RCA stenosis involving the PLA vessel. He has a history of a right DVT with subsequent PE and has been on Coumadin anticoagulation.  Additional problems include a history of right bundle branch block, hypertension, and hyperlipidemia. He has occasional hip discomfort related to his prior hip replacement surgery in 1997. He admits to ankle swelling, left greater than right.  In 2015  his dose of Toprol was reduced from  25 mg to 12.5 mg daily when he had complaints of mild dizziness and was bradycardic.  His symptoms did improve.  He denies recent episodes of chest pain.  When I last saw him he was having indigestion symptoms.  At that time, he was having Tums frequently.  I initiated therapy with Protonix 40 mg, which has completely resolved his previous symptomatology.  He is on Coumadin anticoagulation.  When I last saw him, he was having intermittent gas and abdominal bloating recommended the addition of over-the-counter some methadone to his pantoprazole which he had been on chronically.  Over the past year, he denies any episodes of anginal type symptoms.  He admits to occasional swelling of his left ankle.  He has been having difficulty with his right hip and may ultimately require hip surgery in the future.  He is unaware of palpitations.  He had laboratory 4 days prior to his office visit.  Cholesterol was 138, triglycerides 166, HDL 33, VLDL 33 and LDL cholesterol 72.  One 10 and his creatinine was 1.19.  He had normal LFTs  and a normal CBC.  TSH was normal.  He presents for evaluation.  Past Medical History:  Diagnosis Date  . CAD (coronary artery disease)    CABG  . Colon cancer [facility]) 1997   surgery   . History of DVT (deep vein thrombosis)    right  . History of pulmonary embolism   . History of stress test 09/2008   no change from previous, no significant ischemia, and EF of 68%  . Hx of echocardiogram 11/07/2010   showed normal systolic function, grade 2 diastolic dysfunction and also had abnormal tissue doppler suggesting increased LA pressure, left atrium was mildly dilated, mild to moderate mitral annular calcification with mild MR, moderate tricuspid regurgitation with moderate pulmonary hypertension with estimated RV systolic pressure of 41; trace AR and trace PR  . Hyperlipidemia   . Hypertension   . RBBB (right bundle branch block)   . S/P CABG (coronary artery bypass graft) 01/2006    Past Surgical History:  Procedure Laterality Date  . ANKLE SURGERY  1978   ankle fracture  . CARDIAC CATHETERIZATION  9/292008   severe native CAD with severe diffuse coronary calcif & total occlusion of L main & diffuse native RCA with 30% prox, 70-80% mid, diffused 50% stenosis before & after the crux, 80% distal RCA stenosis involving PLA; patent vein graft to PDA of RCA, patent vein graft to OM2 of Cfx,  patient vein graft supplying diagonal, patent LIMA to mid-distal LAD (Dr. Corky Downs)  . CHOLECYSTECTOMY  2003  . COLONOSCOPY    . CORONARY ARTERY BYPASS GRAFT  02/06/2006   LIMA placed to the LAD, a vein to the diagonal, a vein to the OM2 and a vein to the PDA. ([name]) - diagnostic cath by Dr. Corky Downs on 01/31/2006  . HERNIA REPAIR  2006  . NM MYOCAR PERF WALL MOTION  10/2010   lexiscan; normal patten of perfusion in all regions; post-stress EF 70%, occ PVCs throughout the study, low risk scan   . SPLENECTOMY  1944  . TRANSTHORACIC ECHOCARDIOGRAM  10/2010   EF=>55%, LA mildly dilated, RA mildly  dilated; mild-mod mitral annular calcif, miild MR, mod TR, mod pulm HTN, RSVP 40-59mH; trace AV regurg, trace pulm valve regurg    Allergies  Allergen Reactions  . Vesicare [Solifenacin]     Altered mental status  . Morphine And Related Itching and Rash    Current Outpatient Prescriptions  Medication Sig Dispense Refill  . aspirin 81 MG tablet Take 81 mg by mouth daily.    . finasteride (PROSCAR) 5 MG tablet Take 1 tablet by mouth daily.    . hydrochlorothiazide (MICROZIDE) 12.5 MG capsule Take 1 capsule (12.5 mg total) by mouth daily. 30 capsule 10  . metoprolol succinate (TOPROL-XL) 25 MG 24 hr tablet TAKE 1/2 TABLET BY MOUTH DAILY 30 tablet 0  . pantoprazole (PROTONIX) 40 MG tablet Take 1 tablet (40 mg total) by mouth daily. 30 tablet 4  . Polyethylene Glycol 3350 (MIRALAX PO) Take by mouth. 1 cup full in the AM    . tamsulosin (FLOMAX) 0.4 MG CAPS Take 1 capsule by mouth daily.    .Marland Kitchenwarfarin (COUMADIN) 1 MG tablet TAKE 1 TO 2 TABLETS BY MOUTH DAILY AS DIRECTED BY COUMADIN CLINIC 150 tablet 0  . rosuvastatin (CRESTOR) 20 MG tablet Take 1 tablet (20 mg total) by mouth daily. 30 tablet 6   No current facility-administered medications for this visit.     Socially he is married has 4 children 3 grandchildren 4 great-grandchildren. There is no tobacco or alcohol use. He does not routinely exercise.  ROS General: Negative; No fevers, chills, or night sweats;  HEENT: Negative; No changes in vision or hearing, sinus congestion, difficulty swallowing Pulmonary: Negative; No cough, wheezing, shortness of breath, hemoptysis Cardiovascular: Negative; No chest pain, presyncope, syncope, palpitations GI:  Positive for indigestion GU: Negative; No dysuria, hematuria, or difficulty voiding Musculoskeletal: Negative; no myalgias, joint pain, or weakness Hematologic/Oncology: Remote history of splenomegaly at age 89 Endocrine: Negative; no heat/cold intolerance; no diabetes Neuro: Negative;  no changes in balance, headaches Skin: Lipoma of his left tricep cyst in the back of his neck; No rashes or skin lesions Psychiatric: Negative; No behavioral problems, depression Sleep: Negative; No snoring, daytime sleepiness, hypersomnolence, bruxism, restless legs, hypnogognic hallucinations, no cataplexy Other comprehensive 14 point system review is negative.   PE BP 112/80   Pulse 64   Ht _0  (1.702 m)   Wt 183 lb (83 kg)   BMI 28.66 kg/m    Repeat blood pressure by me was 128/70.  Wt Readings from Last 3 Encounters:  05/03/16 183 lb (83 kg)  06/13/15 163 lb (73.9 kg)  11/22/14 182 lb 8 oz (82.8 kg)   General: Alert, oriented, no distress.  Skin: normal turgor, no rashes HEENT: Normocephalic, atraumatic. Pupils round and reactive; sclera anicteric;no lid lag.  Nose without  nasal septal hypertrophy Mouth/Parynx benign; Mallinpatti scale 2/3 Neck: No JVD, no carotid bruits, normal carotid upstroke. Probable epidermoid cyst on the back of his neck. Lungs: clear to ausculatation and percussion; no wheezing or rales Chest wall: No tenderness to palpation Heart: RRR, s1 s2 normal 1/6 sem; no diastolic murmur. No s3/s4.  No rubs, thrills or heaves. Abdomen: soft, nontender; no hepatosplenomehaly, BS+; abdominal aorta nontender and not dilated by palpation. Back: No CVA tenderness Pulses 2+ Extremities:  Trace left ankle swelling; no clubbing, cyanosis, Homan's sign negative;  lipoma in the region of his left tricep Neurologic: grossly nonfocal Psychological: Normal affect and mood   ECG (independently read by me): Normal sinus rhythm at 64 bpm.  Right bundle-branch block with repolarization changes.  First-degree AV block with a PR interval at 284 ms.  May 2017 ECG (independently read by me): Sinus bradycardia 57 bpm.  First-degree AV block with a PR interval 250 ms.  Right bundle-branch block with repolarization changes.  November 2016 ECG (independently read by me): Sinus  bradycardia 56 bpm.  First-degree AV block.  Right bundle-branch block.  April 2016 ECG (independently read by me):  Normal sinus rhythm at 60 bpm.  Right bundle branch block with repolarization changes.  First-degree AV block with a PR interval at 248 ms.  October 2015ECG (independently read by me): Sinus bradycardia at 57 beats per minute.  Right bundle branch block with repolarization changes.  First degree AV block with a PR interval 226 ms.  Prior January 2015 ECG( independently interpreted by me): Normal sinus rhythm at 61 beats per minute with right bundle branch block and repolarization changes. Probable left anterior hemiblock. First degree AV block with PR interval at 2:30 milliseconds.  Prior ECG of 08/18/2012: Normal sinus rhythm at 59 beats per minute with first-degree AV block. Right bundle branch block with repolarization changes.  LABS:  BMP Latest Ref Rng & Units 04/29/2016 06/28/2015 06/15/2014  Glucose 65 - 99 mg/dL 110(H) 124(H) 135(H)  BUN 7 - 25 mg/dL _0 Creatinine 0.70 - 1.11 mg/dL 1.19(H) 1.31(H) 1.31  Sodium 135 - 146 mmol/L 138 138 139  Potassium 3.5 - 5.3 mmol/L 4.4 4.2 4.7  Chloride 98 - 110 mmol/L 104 107 105  CO2 20 - 31 mmol/L _1 Calcium 8.6 - 10.3 mg/dL 9.0 9.0 9.5    Hepatic Function Latest Ref Rng & Units 04/29/2016 06/28/2015 06/15/2014  Total Protein 6.1 - 8.1 g/dL 6.9 6.5 7.2  Albumin 3.6 - 5.1 g/dL 3.8 3.7 3.7  AST 10 - 35 U/L _2 ALT 9 - 46 U/L _3 Alk Phosphatase 40 - 115 U/L 43 43 53  Total Bilirubin 0.2 - 1.2 mg/dL 1.2 1.1 1.0  Bilirubin, Direct - - - -    CBC Latest Ref Rng & Units 04/29/2016 06/28/2015 06/15/2014  WBC 3.8 - 10.8 K/uL 9.4 9.1 8.8  Hemoglobin 13.2 - 17.1 g/dL 15.3 14.5 15.3  Hematocrit 38.5 - 50.0 % 44.4 43.2 45.9  Platelets 140 - 400 K/uL 348 346 347   Lab Results  Component Value Date   MCV 99.6 04/29/2016   MCV 98.2 06/28/2015   MCV 99.8 06/15/2014      BNP    Component Value Date/Time   PROBNP  943.0 (H) 10/19/2006 1617      Lipid Panel     Component Value Date/Time   CHOL 138 04/29/2016 1415   TRIG 166 (H) 04/29/2016 1415  HDL 33 (L) 04/29/2016 1415   CHOLHDL 4.2 04/29/2016 1415   VLDL 33 (H) 04/29/2016 1415   LDLCALC 72 04/29/2016 1415   INR today is 4.4, supratherapeutic  RADIOLOGY: No results found.  IMPRESSION:  1. Coronary artery disease involving native coronary artery of native heart without angina pectoris   2. Hx of CABG   3. Essential hypertension   4. Hyperlipidemia with target LDL less than 70   5. Medication management   6. RBBB   7. Long term current use of anticoagulant therapy   8. Gastroesophageal reflux disease without esophagitis     ASSESSMENT AND PLAN: Mr. Kindred is an 83 year old male who is 10 years status post CABG revascularization surgery in January 2008.  At catheterization in 2008 all grafts were patent.  He is on chronic Coumadin for his history of PE. He is not having any evidence for abnormal bleeding.  His INR today is elevated at 4.4.  He denies any significant change in diet.  Coumadin dosing was reduced.[name]  He is not having any recurrent anginal symptoms.  His blood pressure today is stable on his regimen consisting of Toprol-XL 12.5 mg and HCTZ 12.5 mg daily.  ECG is stable and he is no longer in sinus bradycardia.  He has right bundle branch block, which is stable.  I reviewed his recent laboratory.  With his established CAD.  I have recommended more aggressive treatment and will increase Crestor to 20 mg daily.  Continues to be on aspirin 81 mg for antiplatelet benefit.  His GERD is controlled with pantoprazole.  He may ultimately require right hip surgery, but this is not scheduled.  His last nuclear stress test was in October 2012.  In 3 months, I have recommended he undergo a six-year follow-up nuclear study.  This can be used for preoperative clearance.  If he does require hip replacement.  He can use to take HCTZ for his trivial  edema.  EP laboratory will be obtained in 3 months and I will see him after his lab work and [employer] study for further evaluation and recommendations.    Time spent: 25 minutes Troy Sine, MD, [facility]  05/05/2016 11:08 AM

## 2016-05-03 NOTE — Patient Instructions (Signed)
Medication Instructions:   The rosuvastatin has been increased from 10 to 20 mg daily.  Labwork:  3 months  Testing/Procedures:  Your physician has requested that you have a lexiscan myoview in July For further information please visit [email or web address]. Please follow instruction sheet, as given.    Follow-Up:  3 months  Any Other Special Instructions Will Be Listed Below (If Applicable).

## 2016-05-13 ENCOUNTER — Encounter: Payer: Self-pay | Admitting: *Deleted

## 2016-05-14 ENCOUNTER — Ambulatory Visit (INDEPENDENT_AMBULATORY_CARE_PROVIDER_SITE_OTHER): Payer: Medicare Other | Admitting: Pharmacist Clinician (PhC)/ Clinical Pharmacy Specialist

## 2016-05-14 DIAGNOSIS — I2699 Other pulmonary embolism without acute cor pulmonale: Secondary | ICD-10-CM

## 2016-05-14 DIAGNOSIS — Z7901 Long term (current) use of anticoagulants: Secondary | ICD-10-CM

## 2016-05-14 DIAGNOSIS — I82409 Acute embolism and thrombosis of unspecified deep veins of unspecified lower extremity: Secondary | ICD-10-CM | POA: Diagnosis not present

## 2016-05-14 LAB — POCT INR: INR: 2.7

## 2016-05-17 ENCOUNTER — Other Ambulatory Visit: Payer: Self-pay | Admitting: Cardiovascular Disease

## 2016-05-17 NOTE — Telephone Encounter (Signed)
REFILL 

## 2016-05-22 ENCOUNTER — Other Ambulatory Visit: Payer: Self-pay | Admitting: Cardiovascular Disease

## 2016-06-11 ENCOUNTER — Ambulatory Visit (INDEPENDENT_AMBULATORY_CARE_PROVIDER_SITE_OTHER): Payer: Medicare Other | Admitting: Pharmacist Clinician (PhC)/ Clinical Pharmacy Specialist

## 2016-06-11 DIAGNOSIS — Z7901 Long term (current) use of anticoagulants: Secondary | ICD-10-CM | POA: Diagnosis not present

## 2016-06-11 DIAGNOSIS — I82409 Acute embolism and thrombosis of unspecified deep veins of unspecified lower extremity: Secondary | ICD-10-CM

## 2016-06-11 DIAGNOSIS — I2699 Other pulmonary embolism without acute cor pulmonale: Secondary | ICD-10-CM | POA: Diagnosis not present

## 2016-06-11 LAB — POCT INR: INR: 3

## 2016-07-12 ENCOUNTER — Other Ambulatory Visit: Payer: Self-pay | Admitting: Cardiovascular Disease

## 2016-07-23 ENCOUNTER — Ambulatory Visit (INDEPENDENT_AMBULATORY_CARE_PROVIDER_SITE_OTHER): Payer: Medicare Other | Admitting: Pharmacist

## 2016-07-23 DIAGNOSIS — I82409 Acute embolism and thrombosis of unspecified deep veins of unspecified lower extremity: Secondary | ICD-10-CM

## 2016-07-23 DIAGNOSIS — E785 Hyperlipidemia, unspecified: Secondary | ICD-10-CM | POA: Diagnosis not present

## 2016-07-23 DIAGNOSIS — Z7901 Long term (current) use of anticoagulants: Secondary | ICD-10-CM

## 2016-07-23 DIAGNOSIS — I2699 Other pulmonary embolism without acute cor pulmonale: Secondary | ICD-10-CM | POA: Diagnosis not present

## 2016-07-23 LAB — POCT INR: INR: 3.6

## 2016-07-24 LAB — COMPREHENSIVE METABOLIC PANEL
A/G RATIO: 1.3 (ref 1.2–2.2)
ALT: 14 IU/L (ref 0–44)
AST: 21 IU/L (ref 0–40)
Albumin: 4 g/dL (ref 3.5–4.7)
Alkaline Phosphatase: 50 IU/L (ref 39–117)
BUN/Creatinine Ratio: 14 (ref 10–24)
BUN: 20 mg/dL (ref 8–27)
Bilirubin Total: 1 mg/dL (ref 0.0–1.2)
CHLORIDE: 102 mmol/L (ref 96–106)
CO2: 23 mmol/L (ref 20–29)
CREATININE: 1.42 mg/dL — AB (ref 0.76–1.27)
Calcium: 9.7 mg/dL (ref 8.6–10.2)
GFR calc Af Amer: 50 mL/min/{1.73_m2} — ABNORMAL LOW (ref >59)
GFR calc non Af Amer: 43 mL/min/{1.73_m2} — ABNORMAL LOW (ref >59)
GLOBULIN, TOTAL: 3.1 g/dL (ref 1.5–4.5)
Glucose: 127 mg/dL — ABNORMAL HIGH (ref 65–99)
POTASSIUM: 4.7 mmol/L (ref 3.5–5.2)
SODIUM: 138 mmol/L (ref 134–144)
Total Protein: 7.1 g/dL (ref 6.0–8.5)

## 2016-07-24 LAB — CBC
HEMOGLOBIN: 14.9 g/dL (ref 13.0–17.7)
Hematocrit: 43.6 % (ref 37.5–51.0)
MCH: 33.5 pg — AB (ref 26.6–33.0)
MCHC: 34.2 g/dL (ref 31.5–35.7)
MCV: 98 fL — ABNORMAL HIGH (ref 79–97)
Platelets: 361 10*3/uL (ref 150–379)
RBC: 4.45 x10E6/uL (ref 4.14–5.80)
RDW: 14.3 % (ref 12.3–15.4)
WBC: 9.5 10*3/uL (ref 3.4–10.8)

## 2016-07-24 LAB — LIPID PANEL
CHOLESTEROL TOTAL: 134 mg/dL (ref 100–199)
Chol/HDL Ratio: 4.1 ratio (ref 0.0–5.0)
HDL: 33 mg/dL — ABNORMAL LOW (ref >39)
LDL Calculated: 67 mg/dL (ref 0–99)
Triglycerides: 172 mg/dL — ABNORMAL HIGH (ref 0–149)
VLDL CHOLESTEROL CAL: 34 mg/dL (ref 5–40)

## 2016-07-24 LAB — TSH: TSH: 2.35 u[IU]/mL (ref 0.450–4.500)

## 2016-07-29 ENCOUNTER — Encounter: Payer: Self-pay | Admitting: *Deleted

## 2016-08-01 ENCOUNTER — Telehealth (HOSPITAL_COMMUNITY): Payer: Self-pay

## 2016-08-01 NOTE — Telephone Encounter (Signed)
Encounter complete. 

## 2016-08-02 ENCOUNTER — Telehealth (HOSPITAL_COMMUNITY): Payer: Self-pay

## 2016-08-02 NOTE — Telephone Encounter (Signed)
Encounter complete. 

## 2016-08-06 ENCOUNTER — Ambulatory Visit (HOSPITAL_COMMUNITY)
Admission: RE | Admit: 2016-08-06 | Discharge: 2016-08-06 | Disposition: A | Discharge status: Home / Self Care | Payer: Medicare Other | Source: Ambulatory Visit | Attending: Cardiovascular Disease | Admitting: Cardiovascular Disease

## 2016-08-06 DIAGNOSIS — K219 Gastro-esophageal reflux disease without esophagitis: Secondary | ICD-10-CM | POA: Diagnosis not present

## 2016-08-06 DIAGNOSIS — Z86711 Personal history of pulmonary embolism: Secondary | ICD-10-CM | POA: Insufficient documentation

## 2016-08-06 DIAGNOSIS — R5383 Other fatigue: Secondary | ICD-10-CM | POA: Diagnosis not present

## 2016-08-06 DIAGNOSIS — I2510 Atherosclerotic heart disease of native coronary artery without angina pectoris: Secondary | ICD-10-CM | POA: Insufficient documentation

## 2016-08-06 DIAGNOSIS — I4510 Unspecified right bundle-branch block: Secondary | ICD-10-CM | POA: Insufficient documentation

## 2016-08-06 DIAGNOSIS — Z87891 Personal history of nicotine dependence: Secondary | ICD-10-CM | POA: Insufficient documentation

## 2016-08-06 DIAGNOSIS — Z8249 Family history of ischemic heart disease and other diseases of the circulatory system: Secondary | ICD-10-CM | POA: Diagnosis not present

## 2016-08-06 DIAGNOSIS — Z86718 Personal history of other venous thrombosis and embolism: Secondary | ICD-10-CM | POA: Diagnosis not present

## 2016-08-06 DIAGNOSIS — R9439 Abnormal result of other cardiovascular function study: Secondary | ICD-10-CM | POA: Insufficient documentation

## 2016-08-06 DIAGNOSIS — Z951 Presence of aortocoronary bypass graft: Secondary | ICD-10-CM | POA: Insufficient documentation

## 2016-08-06 DIAGNOSIS — I10 Essential (primary) hypertension: Secondary | ICD-10-CM | POA: Insufficient documentation

## 2016-08-06 DIAGNOSIS — R0609 Other forms of dyspnea: Secondary | ICD-10-CM | POA: Diagnosis not present

## 2016-08-06 LAB — MYOCARDIAL PERFUSION IMAGING
CHL CUP NUCLEAR SDS: 0
CHL CUP NUCLEAR SRS: 1
CHL CUP NUCLEAR SSS: 1
LV dias vol: 82 mL (ref 62–150)
LVSYSVOL: 31 mL
NUC STRESS TID: 1
Peak HR: 78 {beats}/min
Rest HR: 55 {beats}/min

## 2016-08-06 MED ORDER — TECHNETIUM TC 99M TETROFOSMIN IV KIT
9.6000 | PACK | Freq: Once | INTRAVENOUS | Status: AC | PRN
  Administered 2016-08-06: 9.6 via INTRAVENOUS
  Filled 2016-08-06: qty 10

## 2016-08-06 MED ORDER — REGADENOSON 0.4 MG/5ML IV SOLN
0.4000 mg | Freq: Once | INTRAVENOUS | Status: AC
  Administered 2016-08-06: 0.4 mg via INTRAVENOUS

## 2016-08-06 MED ORDER — TECHNETIUM TC 99M TETROFOSMIN IV KIT
29.9000 | PACK | Freq: Once | INTRAVENOUS | Status: AC | PRN
  Administered 2016-08-06: 29.9 via INTRAVENOUS
  Filled 2016-08-06: qty 30

## 2016-08-20 ENCOUNTER — Ambulatory Visit (INDEPENDENT_AMBULATORY_CARE_PROVIDER_SITE_OTHER): Payer: Medicare Other | Admitting: Cardiovascular Disease

## 2016-08-20 ENCOUNTER — Ambulatory Visit (INDEPENDENT_AMBULATORY_CARE_PROVIDER_SITE_OTHER): Payer: Medicare Other | Admitting: Pharmacist

## 2016-08-20 VITALS — BP 132/70 | HR 66 | Ht 67.0 in | Wt 181.2 lb

## 2016-08-20 DIAGNOSIS — Z7901 Long term (current) use of anticoagulants: Secondary | ICD-10-CM

## 2016-08-20 DIAGNOSIS — I440 Atrioventricular block, first degree: Secondary | ICD-10-CM

## 2016-08-20 DIAGNOSIS — E785 Hyperlipidemia, unspecified: Secondary | ICD-10-CM | POA: Diagnosis not present

## 2016-08-20 DIAGNOSIS — I4510 Unspecified right bundle-branch block: Secondary | ICD-10-CM

## 2016-08-20 DIAGNOSIS — K219 Gastro-esophageal reflux disease without esophagitis: Secondary | ICD-10-CM | POA: Diagnosis not present

## 2016-08-20 DIAGNOSIS — Z951 Presence of aortocoronary bypass graft: Secondary | ICD-10-CM

## 2016-08-20 DIAGNOSIS — I10 Essential (primary) hypertension: Secondary | ICD-10-CM

## 2016-08-20 DIAGNOSIS — I82409 Acute embolism and thrombosis of unspecified deep veins of unspecified lower extremity: Secondary | ICD-10-CM

## 2016-08-20 DIAGNOSIS — I2699 Other pulmonary embolism without acute cor pulmonale: Secondary | ICD-10-CM | POA: Diagnosis not present

## 2016-08-20 DIAGNOSIS — I2510 Atherosclerotic heart disease of native coronary artery without angina pectoris: Secondary | ICD-10-CM | POA: Diagnosis not present

## 2016-08-20 LAB — POCT INR: INR: 2.9

## 2016-08-20 NOTE — Patient Instructions (Signed)

## 2016-08-20 NOTE — Progress Notes (Addendum)
Patient ID: Richard Mendez, male   DOB: 01/12/1926, 83 y.o.   MRN: [id number]      HPI: [name] is a 83 y.o. male who presents to the office for a 4 month cardiology evaluation.  Mr. Thomley has known CAD and underwent CABG surgery on 02/06/2006 by Dr.Van Tright with a LIMA  to the LAD, a vein to the diagonal, vein to the OM 2, and vein to the PDA. His last cardiac catheterization September 2008 showed that all grafts were patent. He did have left main occlusion and an 80% RCA stenosis involving the PLA vessel. He has a history of a right DVT with subsequent PE and has been on Coumadin anticoagulation.  Additional problems include a history of right bundle branch block, hypertension, and hyperlipidemia. He has occasional hip discomfort related to his prior hip replacement surgery in 1997. He admits to ankle swelling, left greater than right.  In 2015  his dose of Toprol was reduced from  25 mg to 12.5 mg daily when he had complaints of mild dizziness and was bradycardic.  His symptoms did improve.  He denies recent episodes of chest pain.  When I last saw him he was having indigestion symptoms.  At that time, he was having Tums frequently.  I initiated therapy with Protonix 40 mg, which has completely resolved his previous symptomatology.  He is on Coumadin anticoagulation.  When I  saw him in 2017, he was having intermittent gas and abdominal bloating recommended the addition of over-the-counter some methadone to his pantoprazole which he had been on chronically.  Over the past year, he denies any episodes of anginal type symptoms.  He admits to occasional swelling of his left ankle.  He has been having difficulty with his right hip and may ultimately require hip surgery in the future.  Recent laboratory prior to his April 2018 office visit:  Cholesterol was 138, triglycerides 166, HDL 33, VLDL 33 and LDL cholesterol 72.  One 10 and his creatinine was 1.19.  He had normal LFTs and a normal CBC.  TSH  was normal.    Since he may ultimately require hip surgery and his last nuclear study was done in October 2012.  I had recommended that he undergo a six-year follow-up nuclear study, which also could be used for preoperative clearance if necessary.  The nuclear study was done on 08/06/2016 and remained entirely normal with an EF of 62%.  There was suggestion of possible septal hypokinesis post CABG.  There are no ECG changes.  He had normal perfusion.  He presents for reevaluation.  Past Medical History:  Diagnosis Date  . CAD (coronary artery disease)    CABG  . Colon cancer [facility]) 1997   surgery   . History of DVT (deep vein thrombosis)    right  . History of pulmonary embolism   . History of stress test 09/2008   no change from previous, no significant ischemia, and EF of 68%  . Hx of echocardiogram 11/07/2010   showed normal systolic function, grade 2 diastolic dysfunction and also had abnormal tissue doppler suggesting increased LA pressure, left atrium was mildly dilated, mild to moderate mitral annular calcification with mild MR, moderate tricuspid regurgitation with moderate pulmonary hypertension with estimated RV systolic pressure of 41; trace AR and trace PR  . Hyperlipidemia   . Hypertension   . RBBB (right bundle branch block)   . S/P CABG (coronary artery bypass graft) 01/2006    Past  Surgical History:  Procedure Laterality Date  . ANKLE SURGERY  1978   ankle fracture  . CARDIAC CATHETERIZATION  9/292008   severe native CAD with severe diffuse coronary calcif & total occlusion of L main & diffuse native RCA with 30% prox, 70-80% mid, diffused 50% stenosis before & after the crux, 80% distal RCA stenosis involving PLA; patent vein graft to PDA of RCA, patent vein graft to OM2 of Cfx, patient vein graft supplying diagonal, patent LIMA to mid-distal LAD (Dr. Corky Downs)  . CHOLECYSTECTOMY  2003  . COLONOSCOPY    . CORONARY ARTERY BYPASS GRAFT  02/06/2006   LIMA placed to the LAD,  a vein to the diagonal, a vein to the OM2 and a vein to the PDA. ([name]) - diagnostic cath by Dr. Corky Downs on 01/31/2006  . HERNIA REPAIR  2006  . NM MYOCAR PERF WALL MOTION  10/2010   lexiscan; normal patten of perfusion in all regions; post-stress EF 70%, occ PVCs throughout the study, low risk scan   . SPLENECTOMY  1944  . TRANSTHORACIC ECHOCARDIOGRAM  10/2010   EF=>55%, LA mildly dilated, RA mildly dilated; mild-mod mitral annular calcif, miild MR, mod TR, mod pulm HTN, RSVP 40-42mH; trace AV regurg, trace pulm valve regurg    Allergies  Allergen Reactions  . Vesicare [Solifenacin]     Altered mental status  . Morphine And Related Itching and Rash    Current Outpatient Prescriptions  Medication Sig Dispense Refill  . aspirin 81 MG tablet Take 81 mg by mouth daily.    . finasteride (PROSCAR) 5 MG tablet Take 1 tablet by mouth daily.    . hydrochlorothiazide (MICROZIDE) 12.5 MG capsule TAKE 1 CAPSULE(12.5 MG) BY MOUTH DAILY 30 capsule 11  . metoprolol succinate (TOPROL-XL) 25 MG 24 hr tablet TAKE 1/2 TABLET BY MOUTH DAILY 15 tablet 3  . pantoprazole (PROTONIX) 40 MG tablet TAKE 1 TABLET BY MOUTH EVERY DAY 30 tablet 3  . Polyethylene Glycol 3350 (MIRALAX PO) Take by mouth. 1 cup full in the AM    . rosuvastatin (CRESTOR) 20 MG tablet Take 1 tablet (20 mg total) by mouth daily. 30 tablet 6  . tamsulosin (FLOMAX) 0.4 MG CAPS Take 1 capsule by mouth daily.    .Marland Kitchenwarfarin (COUMADIN) 1 MG tablet TAKE 1 TO 2 TABLETS BY MOUTH DAILY AS DIRECTED BY COUMADIN CLINIC 150 tablet 1   No current facility-administered medications for this visit.     Socially he is married has 4 children 3 grandchildren 4 great-grandchildren. There is no tobacco or alcohol use. He does not routinely exercise.  ROS General: Negative; No fevers, chills, or night sweats;  HEENT: Negative; No changes in vision or hearing, sinus congestion, difficulty swallowing Pulmonary: Negative; No cough, wheezing,  shortness of breath, hemoptysis Cardiovascular: Negative; No chest pain, presyncope, syncope, palpitations GI:  Positive for indigestion GU: Negative; No dysuria, hematuria, or difficulty voiding Musculoskeletal: Negative; no myalgias, joint pain, or weakness Hematologic/Oncology: Remote history of splenomegaly at age 89 Endocrine: Negative; no heat/cold intolerance; no diabetes Neuro: Negative; no changes in balance, headaches Skin: Lipoma of his left tricep cyst in the back of his neck; No rashes or skin lesions Psychiatric: Negative; No behavioral problems, depression Sleep: Negative; No snoring, daytime sleepiness, hypersomnolence, bruxism, restless legs, hypnogognic hallucinations, no cataplexy Other comprehensive 14 point system review is negative.   PE BP 132/70   Pulse 66   Ht '5\' 7"'  (1.702 m)   Wt 181 lb  3.2 oz (82.2 kg)   BMI 28.38 kg/m    Repeat blood pressure by me was 128/70.  Wt Readings from Last 3 Encounters:  08/20/16 181 lb 3.2 oz (82.2 kg)  08/06/16 183 lb (83 kg)  05/03/16 183 lb (83 kg)    General: Alert, oriented, no distress.  Skin: normal turgor, no rashes, warm and dry HEENT: Normocephalic, atraumatic. Pupils equal round and reactive to light; sclera anicteric; extraocular muscles intact;  Nose without nasal septal hypertrophy Mouth/Parynx benign; Mallinpatti scale 3 Neck: No JVD, no carotid bruits; normal carotid upstroke Lungs: clear to ausculatation and percussion; no wheezing or rales Chest wall: without tenderness to palpitation Heart: PMI not displaced, RRR, s1 s2 normal, 1/6 systolic murmur, no diastolic murmur, no rubs, No S3 or S4 gallops, thrills, or heaves Abdomen: soft, nontender; no hepatosplenomehaly, BS+; abdominal aorta nontender and not dilated by palpation. Back: no CVA tenderness Pulses 2+ Musculoskeletal: full range of motion, normal strength, no joint deformities Extremities: Trivial ankle edema no clubbing cyanosis, Homan's  sign negative  Neurologic: grossly nonfocal; Cranial nerves grossly wnl Psychologic: Normal mood and affect   ECG (independently read by me): Normal sinus rhythm at 66 bpm.  First-degree AV block with a PR interval at 274 ms.  Right bundle-branch block with repolarization changes.  QTc interval 492 ms.  April 2018 ECG (independently read by me): Normal sinus rhythm at 64 bpm.  Right bundle-branch block with repolarization changes.  First-degree AV block with a PR interval at 284 ms.  May 2017 ECG (independently read by me): Sinus bradycardia 57 bpm.  First-degree AV block with a PR interval 250 ms.  Right bundle-branch block with repolarization changes.  November 2016 ECG (independently read by me): Sinus bradycardia 56 bpm.  First-degree AV block.  Right bundle-branch block.  April 2016 ECG (independently read by me):  Normal sinus rhythm at 60 bpm.  Right bundle branch block with repolarization changes.  First-degree AV block with a PR interval at 248 ms.  October 2015ECG (independently read by me): Sinus bradycardia at 57 beats per minute.  Right bundle branch block with repolarization changes.  First degree AV block with a PR interval 226 ms.  Prior January 2015 ECG( independently interpreted by me): Normal sinus rhythm at 61 beats per minute with right bundle branch block and repolarization changes. Probable left anterior hemiblock. First degree AV block with PR interval at 2:30 milliseconds.  Prior ECG of 08/18/2012: Normal sinus rhythm at 59 beats per minute with first-degree AV block. Right bundle branch block with repolarization changes.  LABS:  BMP Latest Ref Rng & Units 07/23/2016 04/29/2016 06/28/2015  Glucose 65 - 99 mg/dL 127(H) 110(H) 124(H)  BUN 8 - 27 mg/dL '20 18 19  ' Creatinine 0.76 - 1.27 mg/dL 1.42(H) 1.19(H) 1.31(H)  BUN/Creat Ratio 10 - 24 14 - -  Sodium 134 - 144 mmol/L 138 138 138  Potassium 3.5 - 5.2 mmol/L 4.7 4.4 4.2  Chloride 96 - 106 mmol/L 102 104 107  CO2 20 -  29 mmol/L '23 27 22  ' Calcium 8.6 - 10.2 mg/dL 9.7 9.0 9.0    Hepatic Function Latest Ref Rng & Units 07/23/2016 04/29/2016 06/28/2015  Total Protein 6.0 - 8.5 g/dL 7.1 6.9 6.5  Albumin 3.5 - 4.7 g/dL 4.0 3.8 3.7  AST 0 - 40 IU/L '21 17 22  ' ALT 0 - 44 IU/L '14 11 14  ' Alk Phosphatase 39 - 117 IU/L 50 43 43  Total Bilirubin 0.0 - 1.2 mg/dL  1.0 1.2 1.1  Bilirubin, Direct - - - -    CBC Latest Ref Rng & Units 07/23/2016 04/29/2016 06/28/2015  WBC 3.4 - 10.8 x10E3/uL 9.5 9.4 9.1  Hemoglobin 13.0 - 17.7 g/dL 14.9 15.3 14.5  Hematocrit 37.5 - 51.0 % 43.6 44.4 43.2  Platelets 150 - 379 x10E3/uL 361 348 346   Lab Results  Component Value Date   MCV 98 (H) 07/23/2016   MCV 99.6 04/29/2016   MCV 98.2 06/28/2015      BNP    Component Value Date/Time   PROBNP 943.0 (H) 10/19/2006 1617      Lipid Panel     Component Value Date/Time   CHOL 134 07/23/2016 1236   TRIG 172 (H) 07/23/2016 1236   HDL 33 (L) 07/23/2016 1236   CHOLHDL 4.1 07/23/2016 1236   CHOLHDL 4.2 04/29/2016 1415   VLDL 33 (H) 04/29/2016 1415   LDLCALC 67 07/23/2016 1236   Most recent INR on 07/23/2016 was 3.6, which was improved from 4.4, previously  RADIOLOGY: No results found.  IMPRESSION:  1. Coronary artery disease involving native coronary artery of native heart without angina pectoris   2. Hx of CABG   3. Essential hypertension   4. Long term current use of anticoagulant therapy   5. Hyperlipidemia with target LDL less than 70   6. RBBB   7. First degree AV block   8. Gastroesophageal reflux disease without esophagitis     ASSESSMENT AND PLAN: Mr. Woodrick is an 83 year old male who is 10 1/2 years status post CABG revascularization surgery in January 2008.  At his last catheterization in 2008 all grafts were patent.  He is on chronic Coumadin for his history of PE. He is not having any evidence for abnormal bleeding.  His INR today was elevated at 4.4 .  When I last saw him, his Coumadin was reduced.  His last  Coumadin in early July was still slightly elevated at 3.6.   He is not having any recurrent anginal symptoms.  I reviewed his most recent nuclear perfusion study done from 08/06/2016.  This continues to be a normal study without scar or ischemia.  Mr. Bartholome Bill blood pressure today is stable on his current medical regimen consisting of HCTZ 12.5 mg and Toprol-XL 12.5 mg..  He's not having any edema.  I reviewed recent laboratory.  When I last saw him, I titrated his Crestor and he now is on Crestor 20 mg for hyperlipidemia and his most recent LDL cholesterol is 67 , which is improved from previously.  However, triglycerides were elevated at 172 and HDL was low at 33.  I discussed reducing sugars and sweets in his diet as well as carbohydrates.  His GERD continues to be controlled on pantoprazole.  His blood pressure today is stable on his regimen consisting of Toprol-XL 12.5 mg and HCTZ 12.5 mg daily.  ECG is stable and he has right bundle branch block with first-degree AV block.  He is no longer in sinus bradycardia.  I reviewed his recent laboratory.  In light of his nuclear study, I will give him clearance.  If he decides to undergo right hip replacement surgery.  However, at present, the patient believes he does not want to pursue this lasts his symptoms become much more debilitating.  As long as he remains stable, I will see him in one year for cardiology reevaluation.  Time spent: 25 minutes Troy Sine, MD, [facility]  08/21/2016 7:16 PM

## 2016-08-21 ENCOUNTER — Encounter: Payer: Self-pay | Admitting: Cardiovascular Disease

## 2016-08-29 ENCOUNTER — Other Ambulatory Visit: Payer: Self-pay | Admitting: Physician Assistant

## 2016-08-29 ENCOUNTER — Ambulatory Visit
Admission: RE | Admit: 2016-08-29 | Discharge: 2016-08-29 | Disposition: A | Discharge status: Home / Self Care | Payer: Medicare Other | Source: Ambulatory Visit | Attending: Physician Assistant | Admitting: Physician Assistant

## 2016-08-29 DIAGNOSIS — R1084 Generalized abdominal pain: Secondary | ICD-10-CM

## 2016-08-29 DIAGNOSIS — R112 Nausea with vomiting, unspecified: Secondary | ICD-10-CM

## 2016-08-29 DIAGNOSIS — Z8719 Personal history of other diseases of the digestive system: Secondary | ICD-10-CM

## 2016-08-30 ENCOUNTER — Ambulatory Visit
Admission: RE | Admit: 2016-08-30 | Discharge: 2016-08-30 | Disposition: A | Discharge status: Home / Self Care | Payer: Medicare Other | Source: Ambulatory Visit | Attending: Physician Assistant | Admitting: Physician Assistant

## 2016-08-30 ENCOUNTER — Other Ambulatory Visit: Payer: Self-pay | Admitting: Physician Assistant

## 2016-08-30 DIAGNOSIS — K56609 Unspecified intestinal obstruction, unspecified as to partial versus complete obstruction: Secondary | ICD-10-CM

## 2016-08-30 DIAGNOSIS — R1084 Generalized abdominal pain: Secondary | ICD-10-CM

## 2016-08-30 DIAGNOSIS — Z85038 Personal history of other malignant neoplasm of large intestine: Secondary | ICD-10-CM

## 2016-08-30 MED ORDER — IOPAMIDOL (ISOVUE-300) INJECTION 61%
75.0000 mL | Freq: Once | INTRAVENOUS | Status: AC | PRN
  Administered 2016-08-30: 75 mL via INTRAVENOUS

## 2016-09-05 ENCOUNTER — Other Ambulatory Visit: Payer: Self-pay | Admitting: Cardiovascular Disease

## 2016-10-01 ENCOUNTER — Ambulatory Visit (INDEPENDENT_AMBULATORY_CARE_PROVIDER_SITE_OTHER): Payer: Medicare Other | Admitting: Pharmacist Clinician (PhC)/ Clinical Pharmacy Specialist

## 2016-10-01 DIAGNOSIS — I2699 Other pulmonary embolism without acute cor pulmonale: Secondary | ICD-10-CM

## 2016-10-01 DIAGNOSIS — I82409 Acute embolism and thrombosis of unspecified deep veins of unspecified lower extremity: Secondary | ICD-10-CM

## 2016-10-01 DIAGNOSIS — Z7901 Long term (current) use of anticoagulants: Secondary | ICD-10-CM

## 2016-10-01 LAB — POCT INR: INR: 2.3

## 2016-11-12 ENCOUNTER — Ambulatory Visit (INDEPENDENT_AMBULATORY_CARE_PROVIDER_SITE_OTHER): Payer: Medicare Other | Admitting: Pharmacist Clinician (PhC)/ Clinical Pharmacy Specialist

## 2016-11-12 DIAGNOSIS — I2699 Other pulmonary embolism without acute cor pulmonale: Secondary | ICD-10-CM

## 2016-11-12 DIAGNOSIS — Z7901 Long term (current) use of anticoagulants: Secondary | ICD-10-CM

## 2016-11-12 DIAGNOSIS — I82409 Acute embolism and thrombosis of unspecified deep veins of unspecified lower extremity: Secondary | ICD-10-CM

## 2016-11-12 LAB — POCT INR: INR: 2.2

## 2016-12-05 ENCOUNTER — Other Ambulatory Visit: Payer: Self-pay | Admitting: Cardiovascular Disease

## 2016-12-24 ENCOUNTER — Ambulatory Visit (INDEPENDENT_AMBULATORY_CARE_PROVIDER_SITE_OTHER): Payer: Medicare Other | Admitting: Pharmacist Clinician (PhC)/ Clinical Pharmacy Specialist

## 2016-12-24 DIAGNOSIS — Z7901 Long term (current) use of anticoagulants: Secondary | ICD-10-CM | POA: Diagnosis not present

## 2016-12-24 DIAGNOSIS — I82409 Acute embolism and thrombosis of unspecified deep veins of unspecified lower extremity: Secondary | ICD-10-CM

## 2016-12-24 DIAGNOSIS — I2699 Other pulmonary embolism without acute cor pulmonale: Secondary | ICD-10-CM | POA: Diagnosis not present

## 2016-12-24 LAB — POCT INR: INR: 1.7

## 2016-12-27 ENCOUNTER — Other Ambulatory Visit: Payer: Self-pay | Admitting: Cardiovascular Disease

## 2017-01-22 ENCOUNTER — Ambulatory Visit (INDEPENDENT_AMBULATORY_CARE_PROVIDER_SITE_OTHER): Payer: Medicare Other | Admitting: Pharmacist Clinician (PhC)/ Clinical Pharmacy Specialist

## 2017-01-22 DIAGNOSIS — I2699 Other pulmonary embolism without acute cor pulmonale: Secondary | ICD-10-CM | POA: Diagnosis not present

## 2017-01-22 DIAGNOSIS — I82409 Acute embolism and thrombosis of unspecified deep veins of unspecified lower extremity: Secondary | ICD-10-CM

## 2017-01-22 DIAGNOSIS — Z7901 Long term (current) use of anticoagulants: Secondary | ICD-10-CM

## 2017-01-22 LAB — POCT INR: INR: 1.7

## 2017-02-12 ENCOUNTER — Ambulatory Visit (INDEPENDENT_AMBULATORY_CARE_PROVIDER_SITE_OTHER): Payer: Medicare Other | Admitting: Pharmacist Clinician (PhC)/ Clinical Pharmacy Specialist

## 2017-02-12 DIAGNOSIS — Z7901 Long term (current) use of anticoagulants: Secondary | ICD-10-CM | POA: Diagnosis not present

## 2017-02-12 DIAGNOSIS — I82409 Acute embolism and thrombosis of unspecified deep veins of unspecified lower extremity: Secondary | ICD-10-CM

## 2017-02-12 DIAGNOSIS — I2699 Other pulmonary embolism without acute cor pulmonale: Secondary | ICD-10-CM | POA: Diagnosis not present

## 2017-02-12 LAB — POCT INR: INR: 2.2

## 2017-03-12 ENCOUNTER — Ambulatory Visit (INDEPENDENT_AMBULATORY_CARE_PROVIDER_SITE_OTHER): Payer: Medicare Other | Admitting: Pharmacist Clinician (PhC)/ Clinical Pharmacy Specialist

## 2017-03-12 DIAGNOSIS — I2699 Other pulmonary embolism without acute cor pulmonale: Secondary | ICD-10-CM

## 2017-03-12 DIAGNOSIS — Z7901 Long term (current) use of anticoagulants: Secondary | ICD-10-CM

## 2017-03-12 DIAGNOSIS — I82409 Acute embolism and thrombosis of unspecified deep veins of unspecified lower extremity: Secondary | ICD-10-CM

## 2017-03-12 LAB — POCT INR: INR: 1.9

## 2017-04-02 ENCOUNTER — Ambulatory Visit (INDEPENDENT_AMBULATORY_CARE_PROVIDER_SITE_OTHER): Payer: Medicare Other | Admitting: Pharmacist Clinician (PhC)/ Clinical Pharmacy Specialist

## 2017-04-02 DIAGNOSIS — I82409 Acute embolism and thrombosis of unspecified deep veins of unspecified lower extremity: Secondary | ICD-10-CM

## 2017-04-02 DIAGNOSIS — Z7901 Long term (current) use of anticoagulants: Secondary | ICD-10-CM

## 2017-04-02 DIAGNOSIS — I2699 Other pulmonary embolism without acute cor pulmonale: Secondary | ICD-10-CM

## 2017-04-02 LAB — POCT INR: INR: 2.4

## 2017-04-30 ENCOUNTER — Ambulatory Visit (INDEPENDENT_AMBULATORY_CARE_PROVIDER_SITE_OTHER): Payer: Medicare Other | Admitting: Pharmacist Clinician (PhC)/ Clinical Pharmacy Specialist

## 2017-04-30 DIAGNOSIS — I2699 Other pulmonary embolism without acute cor pulmonale: Secondary | ICD-10-CM | POA: Diagnosis not present

## 2017-04-30 DIAGNOSIS — Z7901 Long term (current) use of anticoagulants: Secondary | ICD-10-CM | POA: Diagnosis not present

## 2017-04-30 DIAGNOSIS — I82409 Acute embolism and thrombosis of unspecified deep veins of unspecified lower extremity: Secondary | ICD-10-CM

## 2017-04-30 LAB — POCT INR: INR: 2.1

## 2017-06-10 ENCOUNTER — Other Ambulatory Visit: Payer: Self-pay | Admitting: Cardiovascular Disease

## 2017-06-11 ENCOUNTER — Ambulatory Visit (INDEPENDENT_AMBULATORY_CARE_PROVIDER_SITE_OTHER): Payer: Medicare Other | Admitting: Pharmacist

## 2017-06-11 DIAGNOSIS — Z7901 Long term (current) use of anticoagulants: Secondary | ICD-10-CM | POA: Diagnosis not present

## 2017-06-11 DIAGNOSIS — I2699 Other pulmonary embolism without acute cor pulmonale: Secondary | ICD-10-CM

## 2017-06-11 LAB — POCT INR: INR: 2.5 (ref 2.0–3.0)

## 2017-06-13 ENCOUNTER — Other Ambulatory Visit: Payer: Self-pay | Admitting: Cardiovascular Disease

## 2017-07-07 ENCOUNTER — Other Ambulatory Visit: Payer: Self-pay | Admitting: Cardiovascular Disease

## 2017-07-23 ENCOUNTER — Ambulatory Visit (INDEPENDENT_AMBULATORY_CARE_PROVIDER_SITE_OTHER): Payer: Medicare Other | Admitting: Pharmacist

## 2017-07-23 DIAGNOSIS — Z7901 Long term (current) use of anticoagulants: Secondary | ICD-10-CM | POA: Diagnosis not present

## 2017-07-23 DIAGNOSIS — I2699 Other pulmonary embolism without acute cor pulmonale: Secondary | ICD-10-CM

## 2017-07-23 LAB — POCT INR: INR: 3.1 — AB (ref 2.0–3.0)

## 2017-08-04 ENCOUNTER — Ambulatory Visit: Payer: Medicare Other | Admitting: Cardiovascular Disease

## 2017-08-04 ENCOUNTER — Encounter: Payer: Self-pay | Admitting: Cardiovascular Disease

## 2017-08-04 VITALS — BP 103/59 | HR 60 | Ht 67.0 in | Wt 175.0 lb

## 2017-08-04 DIAGNOSIS — M25551 Pain in right hip: Secondary | ICD-10-CM

## 2017-08-04 DIAGNOSIS — Z951 Presence of aortocoronary bypass graft: Secondary | ICD-10-CM

## 2017-08-04 DIAGNOSIS — E785 Hyperlipidemia, unspecified: Secondary | ICD-10-CM | POA: Diagnosis not present

## 2017-08-04 DIAGNOSIS — Z79899 Other long term (current) drug therapy: Secondary | ICD-10-CM | POA: Diagnosis not present

## 2017-08-04 DIAGNOSIS — I10 Essential (primary) hypertension: Secondary | ICD-10-CM

## 2017-08-04 DIAGNOSIS — Z96641 Presence of right artificial hip joint: Secondary | ICD-10-CM

## 2017-08-04 DIAGNOSIS — I2510 Atherosclerotic heart disease of native coronary artery without angina pectoris: Secondary | ICD-10-CM

## 2017-08-04 NOTE — Patient Instructions (Addendum)
Medication Instructions:  Your physician recommends that you continue on your current medications as directed. Please refer to the Current Medication list given to you today.  Labwork: Please return for FASTING labs (CMET, CBC, Lipid, TSH)  Our in office lab hours are Monday-Friday 8:00-4:00, closed for lunch 12:45-1:45 pm.  No appointment needed.  Follow-Up: Your physician wants you to follow-up in: 1 year with Dr. Claiborne Billings. You will receive a reminder letter in the mail two months in advance. If you don't receive a letter, please call our office to schedule the follow-up appointment.  You have been referred to Dr. Alvan Dame at [facility]    Any Other Special Instructions Will Be Listed Below (If Applicable).     If you need a refill on your cardiac medications before your next appointment, please call your pharmacy.

## 2017-08-04 NOTE — Progress Notes (Signed)
Patient ID: Richard Mendez, male   DOB: 1925/08/08, 84 y.o.   MRN: [id number]      HPI: Richard Mendez is a 84 y.o. male who presents to the office for a 12 month cardiology evaluation.  Richard Mendez has known CAD and underwent CABG surgery on 02/06/2006 by Dr.Van Tright with a LIMA  to the LAD, a vein to the diagonal, vein to the OM 2, and vein to the PDA. His last cardiac catheterization September 2008 showed that all grafts were patent. He did have left main occlusion and an 80% RCA stenosis involving the PLA vessel. He has a history of a right DVT with subsequent PE and has been on Coumadin anticoagulation.  Additional problems include a history of right bundle branch block, hypertension, and hyperlipidemia. He has occasional hip discomfort related to his prior hip replacement surgery in 1997. He admits to ankle swelling, left greater than right.  In 2015  his dose of Toprol was reduced from  25 mg to 12.5 mg daily when he had complaints of mild dizziness and was bradycardic.  His symptoms did improve.  He denies recent episodes of chest pain.  When I last saw him he was having indigestion symptoms.  At that time, he was having Tums frequently.  I initiated therapy with Protonix 40 mg, which has completely resolved his previous symptomatology.  He is on Coumadin anticoagulation.  When I  saw him in 2017, he was having intermittent gas and abdominal bloating recommended the addition of over-the-counter some methadone to his pantoprazole which he had been on chronically.  Over the past year, he denies any episodes of anginal type symptoms.  He admits to occasional swelling of his left ankle.  He has been having difficulty with his right hip and may ultimately require hip surgery in the future.  Recent laboratory prior to his April 2018 office visit:  Cholesterol was 138, triglycerides 166, HDL 33, VLDL 33 and LDL cholesterol 72.  One 10 and his creatinine was 1.19.  He had normal LFTs and a normal CBC.  TSH  was normal.    Since he may ultimately require hip surgery  I had recommended that he undergo a six-year follow-up nuclear study, which also could be used for preoperative clearance if necessary.  The nuclear study was done on 08/06/2016 and remained entirely normal with an EF of 62%.  There was suggestion of possible septal hypokinesis post CABG.  There are no ECG changes.  He had normal perfusion.    I last saw him in July 2018.  Since that time, he continues to be bothered by right hip discomfort.  He had remotely undergone hip replacement  over 20 years ago.  He is requested a referral to see orthopedist since the orthopedist had retired.   He denies any episodes of chest pain.  He denies any palpitations.  He denies presyncope or syncope.  He denies any PND orthopnea.  He has continued to be on anticoagulation with his remote history of DVT with subsequent PE.  He presents for evaluation.  Past Medical History:  Diagnosis Date  . CAD (coronary artery disease)    CABG  . Colon cancer [facility]) 1997   surgery   . History of DVT (deep vein thrombosis)    right  . History of pulmonary embolism   . History of stress test 09/2008   no change from previous, no significant ischemia, and EF of 68%  . Hx of echocardiogram 11/07/2010  showed normal systolic function, grade 2 diastolic dysfunction and also had abnormal tissue doppler suggesting increased LA pressure, left atrium was mildly dilated, mild to moderate mitral annular calcification with mild MR, moderate tricuspid regurgitation with moderate pulmonary hypertension with estimated RV systolic pressure of 41; trace AR and trace PR  . Hyperlipidemia   . Hypertension   . RBBB (right bundle branch block)   . S/P CABG (coronary artery bypass graft) 01/2006    Past Surgical History:  Procedure Laterality Date  . ANKLE SURGERY  1978   ankle fracture  . CARDIAC CATHETERIZATION  9/292008   severe native CAD with severe diffuse coronary calcif &  total occlusion of L main & diffuse native RCA with 30% prox, 70-80% mid, diffused 50% stenosis before & after the crux, 80% distal RCA stenosis involving PLA; patent vein graft to PDA of RCA, patent vein graft to OM2 of Cfx, patient vein graft supplying diagonal, patent LIMA to mid-distal LAD (Dr. Corky Downs)  . CHOLECYSTECTOMY  2003  . COLONOSCOPY    . CORONARY ARTERY BYPASS GRAFT  02/06/2006   LIMA placed to the LAD, a vein to the diagonal, a vein to the OM2 and a vein to the PDA. ([name]) - diagnostic cath by Dr. Corky Downs on 01/31/2006  . HERNIA REPAIR  2006  . NM MYOCAR PERF WALL MOTION  10/2010   lexiscan; normal patten of perfusion in all regions; post-stress EF 70%, occ PVCs throughout the study, low risk scan   . SPLENECTOMY  1944  . TRANSTHORACIC ECHOCARDIOGRAM  10/2010   EF=>55%, LA mildly dilated, RA mildly dilated; mild-mod mitral annular calcif, miild MR, mod TR, mod pulm HTN, RSVP 40-73mH; trace AV regurg, trace pulm valve regurg    Allergies  Allergen Reactions  . Vesicare [Solifenacin]     Altered mental status  . Morphine And Related Itching and Rash    Current Outpatient Medications  Medication Sig Dispense Refill  . aspirin 81 MG tablet Take 81 mg by mouth daily.    . finasteride (PROSCAR) 5 MG tablet Take 1 tablet by mouth daily.    . hydrochlorothiazide (MICROZIDE) 12.5 MG capsule TAKE 1 CAPSULE(12.5 MG) BY MOUTH DAILY 30 capsule 0  . metoprolol succinate (TOPROL-XL) 25 MG 24 hr tablet TAKE 1/2 TABLET BY MOUTH DAILY 15 tablet 11  . pantoprazole (PROTONIX) 40 MG tablet TAKE 1 TABLET BY MOUTH EVERY DAY 30 tablet 11  . Polyethylene Glycol 3350 (MIRALAX PO) Take by mouth. 1 cup full in the AM    . rosuvastatin (CRESTOR) 20 MG tablet TAKE 1 TABLET(20 MG) BY MOUTH DAILY 30 tablet 0  . tamsulosin (FLOMAX) 0.4 MG CAPS Take 1 capsule by mouth daily.    .Marland Kitchenwarfarin (COUMADIN) 1 MG tablet TAKE 1 TO 2 TABLETS BY MOUTH DAILY AS DIRECTED BY COUMADIN CLINIC 150 tablet 1   No  current facility-administered medications for this visit.     Socially he is married has 4 children 3 grandchildren 4 great-grandchildren. There is no tobacco or alcohol use. He does not routinely exercise.  ROS General: Negative; No fevers, chills, or night sweats;  HEENT: Negative; No changes in vision or hearing, sinus congestion, difficulty swallowing Pulmonary: Negative; No cough, wheezing, shortness of breath, hemoptysis Cardiovascular: Negative; No chest pain, presyncope, syncope, palpitations GI:  Positive for indigestion GU: Negative; No dysuria, hematuria, or difficulty voiding Musculoskeletal: Negative; no myalgias, joint pain, or weakness Hematologic/Oncology: Remote history of splenomegaly at age 84 Endocrine: Negative; no  heat/cold intolerance; no diabetes Neuro: Negative; no changes in balance, headaches Skin: Lipoma of his left tricep cyst in the back of his neck; No rashes or skin lesions Psychiatric: Negative; No behavioral problems, depression Sleep: Negative; No snoring, daytime sleepiness, hypersomnolence, bruxism, restless legs, hypnogognic hallucinations, no cataplexy Other comprehensive 14 point system review is negative.   PE BP (!) 103/59   Pulse 60   Ht '5\' 7"'  (1.702 m)   Wt 175 lb (79.4 kg)   SpO2 90%   BMI 27.41 kg/m    Repeat blood pressure by me was 112/62.  Wt Readings from Last 3 Encounters:  08/04/17 175 lb (79.4 kg)  08/20/16 181 lb 3.2 oz (82.2 kg)  08/06/16 183 lb (83 kg)   General: Alert, oriented, no distress.  Skin: normal turgor, no rashes, warm and dry HEENT: Normocephalic, atraumatic. Pupils equal round and reactive to light; sclera anicteric; extraocular muscles intact;  Nose without nasal septal hypertrophy Mouth/Parynx benign; Mallinpatti scale 3 Neck: No JVD, no carotid bruits; normal carotid upstroke Lungs: clear to ausculatation and percussion; no wheezing or rales Chest wall: without tenderness to palpitation Heart: PMI  not displaced, RRR, s1 s2 normal, 1/6 systolic murmur, no diastolic murmur, no rubs, gallops, thrills, or heaves Abdomen: soft, nontender; no hepatosplenomehaly, BS+; abdominal aorta nontender and not dilated by palpation. Back: no CVA tenderness Pulses 2+ Musculoskeletal: full range of motion, normal strength, no joint deformities Extremities: Trivial ankle edema; no clubbing cyanosis or edema, Homan's sign negative  Neurologic: grossly nonfocal; Cranial nerves grossly wnl Psychologic: Normal mood and affect    ECG (independently read by me): Sinus rhythm with sinus arrhythmia.  Heart rate 60 bpm.  First-degree AV block with a PR interval of 248 ms.  Right bundle branch block with repolarization changes.  July 2018 ECG (independently read by me): Normal sinus rhythm at 66 bpm.  First-degree AV block with a PR interval at 274 ms.  Right bundle-branch block with repolarization changes.  QTc interval 492 ms.  April 2018 ECG (independently read by me): Normal sinus rhythm at 64 bpm.  Right bundle-branch block with repolarization changes.  First-degree AV block with a PR interval at 284 ms.  May 2017 ECG (independently read by me): Sinus bradycardia 57 bpm.  First-degree AV block with a PR interval 250 ms.  Right bundle-branch block with repolarization changes.  November 2016 ECG (independently read by me): Sinus bradycardia 56 bpm.  First-degree AV block.  Right bundle-branch block.  April 2016 ECG (independently read by me):  Normal sinus rhythm at 60 bpm.  Right bundle branch block with repolarization changes.  First-degree AV block with a PR interval at 248 ms.  October 2015ECG (independently read by me): Sinus bradycardia at 57 beats per minute.  Right bundle branch block with repolarization changes.  First degree AV block with a PR interval 226 ms.  Prior January 2015 ECG( independently interpreted by me): Normal sinus rhythm at 61 beats per minute with right bundle branch block and  repolarization changes. Probable left anterior hemiblock. First degree AV block with PR interval at 2:30 milliseconds.  Prior ECG of 08/18/2012: Normal sinus rhythm at 59 beats per minute with first-degree AV block. Right bundle branch block with repolarization changes.  LABS:  BMP Latest Ref Rng & Units 07/23/2016 04/29/2016 06/28/2015  Glucose 65 - 99 mg/dL 127(H) 110(H) 124(H)  BUN 8 - 27 mg/dL '20 18 19  ' Creatinine 0.76 - 1.27 mg/dL 1.42(H) 1.19(H) 1.31(H)  BUN/Creat Ratio 10 -  24 14 - -  Sodium 134 - 144 mmol/L 138 138 138  Potassium 3.5 - 5.2 mmol/L 4.7 4.4 4.2  Chloride 96 - 106 mmol/L 102 104 107  CO2 20 - 29 mmol/L '23 27 22  ' Calcium 8.6 - 10.2 mg/dL 9.7 9.0 9.0    Hepatic Function Latest Ref Rng & Units 07/23/2016 04/29/2016 06/28/2015  Total Protein 6.0 - 8.5 g/dL 7.1 6.9 6.5  Albumin 3.5 - 4.7 g/dL 4.0 3.8 3.7  AST 0 - 40 IU/L '21 17 22  ' ALT 0 - 44 IU/L '14 11 14  ' Alk Phosphatase 39 - 117 IU/L 50 43 43  Total Bilirubin 0.0 - 1.2 mg/dL 1.0 1.2 1.1  Bilirubin, Direct - - - -    CBC Latest Ref Rng & Units 07/23/2016 04/29/2016 06/28/2015  WBC 3.4 - 10.8 x10E3/uL 9.5 9.4 9.1  Hemoglobin 13.0 - 17.7 g/dL 14.9 15.3 14.5  Hematocrit 37.5 - 51.0 % 43.6 44.4 43.2  Platelets 150 - 379 x10E3/uL 361 348 346   Lab Results  Component Value Date   MCV 98 (H) 07/23/2016   MCV 99.6 04/29/2016   MCV 98.2 06/28/2015      BNP    Component Value Date/Time   PROBNP 943.0 (H) 10/19/2006 1617      Lipid Panel     Component Value Date/Time   CHOL 134 07/23/2016 1236   TRIG 172 (H) 07/23/2016 1236   HDL 33 (L) 07/23/2016 1236   CHOLHDL 4.1 07/23/2016 1236   CHOLHDL 4.2 04/29/2016 1415   VLDL 33 (H) 04/29/2016 1415   LDLCALC 67 07/23/2016 1236     RADIOLOGY: No results found.  IMPRESSION:  1. Coronary artery disease involving native coronary artery of native heart without angina pectoris   2. Hx of CABG   3. Essential hypertension   4. Hyperlipidemia with target LDL less than 70     5. Medication management   6. Pain of right hip joint   7. History of right hip replacement     ASSESSMENT AND PLAN: Mr. Theurer is an 84 year-old male who is 72 1/2 years status post CABG revascularization surgery in January 2008.  At his last catheterization in 2008 all grafts were patent.  He is on chronic Coumadin for his history of PE. He is not having any evidence for abnormal bleeding.  With reference to his CAD, he remains asymptomatic and is not having any recurrent anginal symptoms.  In July 2018 he underwent a 6-year follow-up nuclear perfusion study which continued to be normal and did not demonstrate scar or ischemia.   He is not having any recurrent anginal symptoms.  I reviewed his most recent nuclear perfusion study done from 08/06/2016.  His blood pressure today is stable on his medical regimen consisting of hydrochlorothiazide 12.5 mg and Toprol-XL 12.5 mg daily.  He has chronic right bundle branch block and first-degree AV block.  He continues to be on rosuvastatin 20 mg daily for hyperlipidemia with target LDL less than 70.  In July 2018 his LDL was 67.  Not had recent fasting laboratory.  He continues to be bothered by right hip pain.  He underwent initial hip surgery 1997 by Dr.Krege at [place].  I will refer him to Dr. Adriana Mccallum for further evaluation of his current hip discomfort.  He continues to be on warfarin for anticoagulation.  There is no bleeding.  I am recommending a complete set of fasting laboratory.  As long as he remains  stable, I will see him in 1 year for evaluation unless there are plans for repeat hip surgery and if so we will see him for preoperative assessment.   Time spent: 25 minutes Troy Sine, MD, [facility]  08/05/2017 7:27 PM

## 2017-08-05 ENCOUNTER — Other Ambulatory Visit: Payer: Self-pay | Admitting: Cardiovascular Disease

## 2017-08-05 ENCOUNTER — Encounter: Payer: Self-pay | Admitting: Cardiovascular Disease

## 2017-08-09 LAB — COMPREHENSIVE METABOLIC PANEL
ALT: 18 IU/L (ref 0–44)
AST: 21 IU/L (ref 0–40)
Albumin/Globulin Ratio: 1 — ABNORMAL LOW (ref 1.2–2.2)
Albumin: 3.5 g/dL (ref 3.2–4.6)
Alkaline Phosphatase: 56 IU/L (ref 39–117)
BUN/Creatinine Ratio: 15 (ref 10–24)
BUN: 19 mg/dL (ref 10–36)
Bilirubin Total: 1.1 mg/dL (ref 0.0–1.2)
CO2: 22 mmol/L (ref 20–29)
CREATININE: 1.28 mg/dL — AB (ref 0.76–1.27)
Calcium: 9.1 mg/dL (ref 8.6–10.2)
Chloride: 102 mmol/L (ref 96–106)
GFR calc non Af Amer: 49 mL/min/{1.73_m2} — ABNORMAL LOW (ref >59)
GFR, EST AFRICAN AMERICAN: 57 mL/min/{1.73_m2} — AB (ref >59)
Globulin, Total: 3.6 g/dL (ref 1.5–4.5)
Glucose: 132 mg/dL — ABNORMAL HIGH (ref 65–99)
Potassium: 5.1 mmol/L (ref 3.5–5.2)
Sodium: 138 mmol/L (ref 134–144)
TOTAL PROTEIN: 7.1 g/dL (ref 6.0–8.5)

## 2017-08-09 LAB — CBC
HEMATOCRIT: 43.4 % (ref 37.5–51.0)
HEMOGLOBIN: 14.8 g/dL (ref 13.0–17.7)
MCH: 33.7 pg — AB (ref 26.6–33.0)
MCHC: 34.1 g/dL (ref 31.5–35.7)
MCV: 99 fL — ABNORMAL HIGH (ref 79–97)
Platelets: 347 10*3/uL (ref 150–450)
RBC: 4.39 x10E6/uL (ref 4.14–5.80)
RDW: 14.9 % (ref 12.3–15.4)
WBC: 6.9 10*3/uL (ref 3.4–10.8)

## 2017-08-09 LAB — TSH: TSH: 2.98 u[IU]/mL (ref 0.450–4.500)

## 2017-08-09 LAB — LIPID PANEL
CHOL/HDL RATIO: 5.3 ratio — AB (ref 0.0–5.0)
Cholesterol, Total: 117 mg/dL (ref 100–199)
HDL: 22 mg/dL — ABNORMAL LOW (ref >39)
LDL CALC: 62 mg/dL (ref 0–99)
TRIGLYCERIDES: 165 mg/dL — AB (ref 0–149)
VLDL Cholesterol Cal: 33 mg/dL (ref 5–40)

## 2017-08-25 ENCOUNTER — Encounter: Payer: Self-pay | Admitting: Cardiovascular Disease

## 2017-09-03 ENCOUNTER — Ambulatory Visit (INDEPENDENT_AMBULATORY_CARE_PROVIDER_SITE_OTHER): Payer: Medicare Other | Admitting: Pharmacist

## 2017-09-03 DIAGNOSIS — Z7901 Long term (current) use of anticoagulants: Secondary | ICD-10-CM

## 2017-09-03 DIAGNOSIS — I2699 Other pulmonary embolism without acute cor pulmonale: Secondary | ICD-10-CM

## 2017-09-03 LAB — POCT INR: INR: 2.9 (ref 2.0–3.0)

## 2017-09-04 ENCOUNTER — Other Ambulatory Visit: Payer: Self-pay | Admitting: Cardiovascular Disease

## 2017-10-15 ENCOUNTER — Ambulatory Visit (INDEPENDENT_AMBULATORY_CARE_PROVIDER_SITE_OTHER): Payer: Medicare Other | Admitting: Pharmacist

## 2017-10-15 DIAGNOSIS — I2699 Other pulmonary embolism without acute cor pulmonale: Secondary | ICD-10-CM

## 2017-10-15 DIAGNOSIS — Z7901 Long term (current) use of anticoagulants: Secondary | ICD-10-CM | POA: Diagnosis not present

## 2017-10-15 LAB — POCT INR: INR: 2.6 (ref 2.0–3.0)

## 2017-11-25 ENCOUNTER — Other Ambulatory Visit: Payer: Self-pay | Admitting: Cardiovascular Disease

## 2017-11-26 ENCOUNTER — Ambulatory Visit (INDEPENDENT_AMBULATORY_CARE_PROVIDER_SITE_OTHER): Payer: Medicare Other | Admitting: Pharmacist

## 2017-11-26 DIAGNOSIS — Z7901 Long term (current) use of anticoagulants: Secondary | ICD-10-CM

## 2017-11-26 DIAGNOSIS — I2699 Other pulmonary embolism without acute cor pulmonale: Secondary | ICD-10-CM

## 2017-11-26 LAB — POCT INR: INR: 2.7 (ref 2.0–3.0)

## 2018-01-07 ENCOUNTER — Ambulatory Visit (INDEPENDENT_AMBULATORY_CARE_PROVIDER_SITE_OTHER): Payer: Medicare Other | Admitting: Pharmacist

## 2018-01-07 DIAGNOSIS — Z7901 Long term (current) use of anticoagulants: Secondary | ICD-10-CM | POA: Diagnosis not present

## 2018-01-07 DIAGNOSIS — I2699 Other pulmonary embolism without acute cor pulmonale: Secondary | ICD-10-CM

## 2018-01-07 LAB — POCT INR: INR: 3 (ref 2.0–3.0)

## 2018-02-19 ENCOUNTER — Ambulatory Visit (INDEPENDENT_AMBULATORY_CARE_PROVIDER_SITE_OTHER): Payer: Medicare Other | Admitting: Pharmacist Clinician (PhC)/ Clinical Pharmacy Specialist

## 2018-02-19 DIAGNOSIS — I824Y9 Acute embolism and thrombosis of unspecified deep veins of unspecified proximal lower extremity: Secondary | ICD-10-CM | POA: Diagnosis not present

## 2018-02-19 DIAGNOSIS — I2699 Other pulmonary embolism without acute cor pulmonale: Secondary | ICD-10-CM

## 2018-02-19 DIAGNOSIS — Z7901 Long term (current) use of anticoagulants: Secondary | ICD-10-CM | POA: Diagnosis not present

## 2018-02-19 LAB — POCT INR: INR: 2.2 (ref 2.0–3.0)

## 2018-03-31 ENCOUNTER — Ambulatory Visit (INDEPENDENT_AMBULATORY_CARE_PROVIDER_SITE_OTHER): Payer: Medicare Other | Admitting: Pharmacist Clinician (PhC)/ Clinical Pharmacy Specialist

## 2018-03-31 DIAGNOSIS — Z7901 Long term (current) use of anticoagulants: Secondary | ICD-10-CM

## 2018-03-31 DIAGNOSIS — I2699 Other pulmonary embolism without acute cor pulmonale: Secondary | ICD-10-CM

## 2018-03-31 LAB — POCT INR: INR: 1.8 — AB (ref 2.0–3.0)

## 2018-04-28 ENCOUNTER — Other Ambulatory Visit: Payer: Self-pay | Admitting: Cardiovascular Disease

## 2018-05-05 NOTE — Telephone Encounter (Signed)
Family aware of drive thru coumadin clinic

## 2018-05-11 ENCOUNTER — Telehealth: Payer: Self-pay

## 2018-05-11 NOTE — Telephone Encounter (Signed)

## 2018-05-12 ENCOUNTER — Ambulatory Visit (INDEPENDENT_AMBULATORY_CARE_PROVIDER_SITE_OTHER): Payer: Medicare Other | Admitting: Pharmacist

## 2018-05-12 ENCOUNTER — Other Ambulatory Visit: Payer: Self-pay

## 2018-05-12 DIAGNOSIS — I2699 Other pulmonary embolism without acute cor pulmonale: Secondary | ICD-10-CM | POA: Diagnosis not present

## 2018-05-12 DIAGNOSIS — Z7901 Long term (current) use of anticoagulants: Secondary | ICD-10-CM

## 2018-05-12 LAB — POCT INR: INR: 2.4 (ref 2.0–3.0)

## 2018-06-30 ENCOUNTER — Telehealth: Payer: Self-pay

## 2018-06-30 NOTE — Telephone Encounter (Signed)
Doesn't accept blocked calls

## 2018-07-03 NOTE — Telephone Encounter (Signed)
Spoke with daughter, they are asking about who can come with patient (wheelchair bound) to his appt on Tuesday for INR.    Asked that either she or her mother, but not both, come up.  The other would need to stay in the car.  Daughter voiced understanding.

## 2018-07-07 ENCOUNTER — Other Ambulatory Visit: Payer: Self-pay

## 2018-07-07 ENCOUNTER — Ambulatory Visit (INDEPENDENT_AMBULATORY_CARE_PROVIDER_SITE_OTHER): Payer: Medicare Other | Admitting: Pharmacist Clinician (PhC)/ Clinical Pharmacy Specialist

## 2018-07-07 DIAGNOSIS — Z7901 Long term (current) use of anticoagulants: Secondary | ICD-10-CM | POA: Diagnosis not present

## 2018-07-07 DIAGNOSIS — I2699 Other pulmonary embolism without acute cor pulmonale: Secondary | ICD-10-CM

## 2018-07-07 LAB — POCT INR: INR: 2.3 (ref 2.0–3.0)

## 2018-07-28 ENCOUNTER — Other Ambulatory Visit: Payer: Self-pay | Admitting: Cardiovascular Disease

## 2018-09-01 ENCOUNTER — Other Ambulatory Visit: Payer: Self-pay

## 2018-09-01 ENCOUNTER — Ambulatory Visit (INDEPENDENT_AMBULATORY_CARE_PROVIDER_SITE_OTHER): Payer: Medicare Other | Admitting: Pharmacist

## 2018-09-01 DIAGNOSIS — I2699 Other pulmonary embolism without acute cor pulmonale: Secondary | ICD-10-CM | POA: Diagnosis not present

## 2018-09-01 DIAGNOSIS — Z7901 Long term (current) use of anticoagulants: Secondary | ICD-10-CM

## 2018-09-01 LAB — POCT INR: INR: 2.6 (ref 2.0–3.0)

## 2018-10-27 ENCOUNTER — Other Ambulatory Visit: Payer: Self-pay

## 2018-10-27 ENCOUNTER — Ambulatory Visit (INDEPENDENT_AMBULATORY_CARE_PROVIDER_SITE_OTHER): Payer: Medicare Other | Admitting: Pharmacist

## 2018-10-27 DIAGNOSIS — I2699 Other pulmonary embolism without acute cor pulmonale: Secondary | ICD-10-CM

## 2018-10-27 DIAGNOSIS — Z7901 Long term (current) use of anticoagulants: Secondary | ICD-10-CM | POA: Diagnosis not present

## 2018-10-27 LAB — POCT INR: INR: 2.2 (ref 2.0–3.0)

## 2018-11-02 ENCOUNTER — Other Ambulatory Visit: Payer: Self-pay

## 2018-11-02 ENCOUNTER — Other Ambulatory Visit: Payer: Self-pay | Admitting: Cardiovascular Disease

## 2018-11-02 MED ORDER — WARFARIN SODIUM 1 MG PO TABS: ORAL_TABLET | ORAL | 0 refills | Status: DC

## 2018-11-04 ENCOUNTER — Other Ambulatory Visit: Payer: Self-pay

## 2018-11-04 MED ORDER — WARFARIN SODIUM 1 MG PO TABS: ORAL_TABLET | ORAL | 0 refills | Status: DC

## 2018-12-22 ENCOUNTER — Other Ambulatory Visit: Payer: Self-pay

## 2018-12-22 ENCOUNTER — Ambulatory Visit (INDEPENDENT_AMBULATORY_CARE_PROVIDER_SITE_OTHER): Payer: Medicare Other | Admitting: Pharmacist Clinician (PhC)/ Clinical Pharmacy Specialist

## 2018-12-22 DIAGNOSIS — I2699 Other pulmonary embolism without acute cor pulmonale: Secondary | ICD-10-CM | POA: Diagnosis not present

## 2018-12-22 DIAGNOSIS — Z7901 Long term (current) use of anticoagulants: Secondary | ICD-10-CM | POA: Diagnosis not present

## 2018-12-22 LAB — POCT INR: INR: 2.8 (ref 2.0–3.0)

## 2019-01-07 ENCOUNTER — Other Ambulatory Visit: Payer: Self-pay | Admitting: Cardiovascular Disease

## 2019-01-07 ENCOUNTER — Other Ambulatory Visit: Payer: Self-pay | Admitting: *Deleted

## 2019-01-07 MED ORDER — HYDROCHLOROTHIAZIDE 12.5 MG PO CAPS: ORAL_CAPSULE | ORAL | 1 refills | Status: DC

## 2019-01-07 MED ORDER — ROSUVASTATIN CALCIUM 20 MG PO TABS: ORAL_TABLET | ORAL | 0 refills | Status: DC

## 2019-01-11 ENCOUNTER — Other Ambulatory Visit: Payer: Self-pay

## 2019-01-11 MED ORDER — ROSUVASTATIN CALCIUM 20 MG PO TABS: ORAL_TABLET | ORAL | 0 refills | Status: DC

## 2019-01-11 MED ORDER — HYDROCHLOROTHIAZIDE 12.5 MG PO CAPS: ORAL_CAPSULE | ORAL | 0 refills | Status: DC

## 2019-02-15 ENCOUNTER — Other Ambulatory Visit: Payer: Self-pay

## 2019-02-15 ENCOUNTER — Ambulatory Visit (INDEPENDENT_AMBULATORY_CARE_PROVIDER_SITE_OTHER): Payer: Medicare Other | Admitting: Pharmacist Clinician (PhC)/ Clinical Pharmacy Specialist

## 2019-02-15 ENCOUNTER — Encounter: Payer: Self-pay | Admitting: Pharmacist Clinician (PhC)/ Clinical Pharmacy Specialist

## 2019-02-15 DIAGNOSIS — I2699 Other pulmonary embolism without acute cor pulmonale: Secondary | ICD-10-CM

## 2019-02-15 DIAGNOSIS — Z7901 Long term (current) use of anticoagulants: Secondary | ICD-10-CM

## 2019-02-15 LAB — POCT INR: INR: 2.3 (ref 2.0–3.0)

## 2019-04-05 ENCOUNTER — Other Ambulatory Visit: Payer: Self-pay | Admitting: Cardiovascular Disease

## 2019-04-12 ENCOUNTER — Ambulatory Visit (INDEPENDENT_AMBULATORY_CARE_PROVIDER_SITE_OTHER): Payer: Medicare Other | Admitting: Pharmacist

## 2019-04-12 ENCOUNTER — Other Ambulatory Visit: Payer: Self-pay

## 2019-04-12 DIAGNOSIS — Z7901 Long term (current) use of anticoagulants: Secondary | ICD-10-CM

## 2019-04-12 DIAGNOSIS — I2699 Other pulmonary embolism without acute cor pulmonale: Secondary | ICD-10-CM

## 2019-04-12 LAB — POCT INR: INR: 2.6 (ref 2.0–3.0)

## 2019-04-22 IMAGING — CT CT ABD-PELV W/ CM
3 of 5 series · 12 of 36 positions shown, 18 images · IV contrast (iopamidol)
Comparison: CT scan 07/13/2009

CLINICAL DATA: History of colon cancer with abdominal pain for the
past 3-4 days. Nausea, vomiting and constipation. Remote history of
colon cancer resection.

EXAM:
CT ABDOMEN AND PELVIS WITH CONTRAST
TECHNIQUE: Multidetector CT imaging of the abdomen and pelvis was performed
using the standard protocol following bolus administration of
intravenous contrast.
CONTRAST:  75mL [id number] IOPAMIDOL ([id number]) INJECTION 61%

[Series 3: abd/pelvis with · axial · 0.80mm/px · z∈[-391,-6]mm · 8 of 100 slices shown, 13 images]
[im 12/100  soft-tissue]
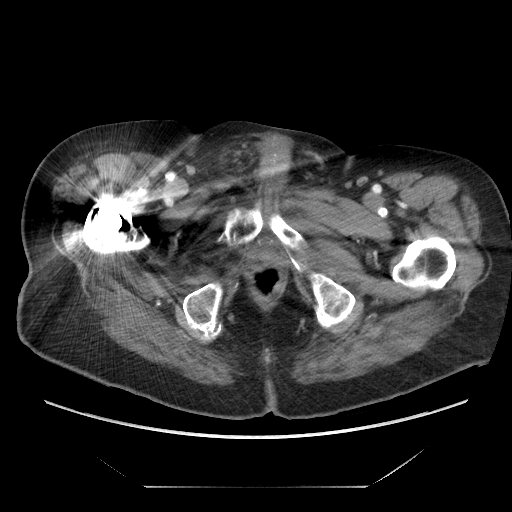
[im 12/100  bone]
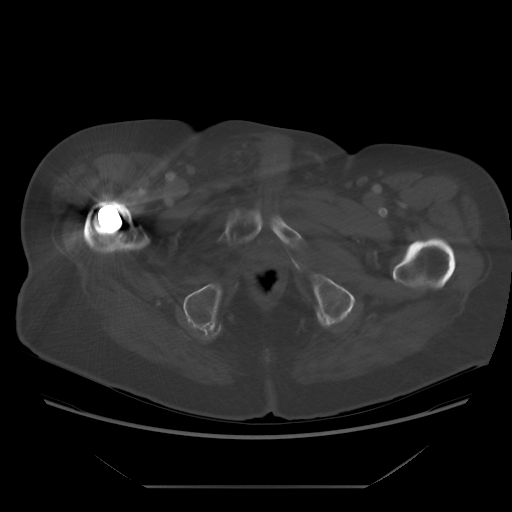
[im 23/100  soft-tissue]
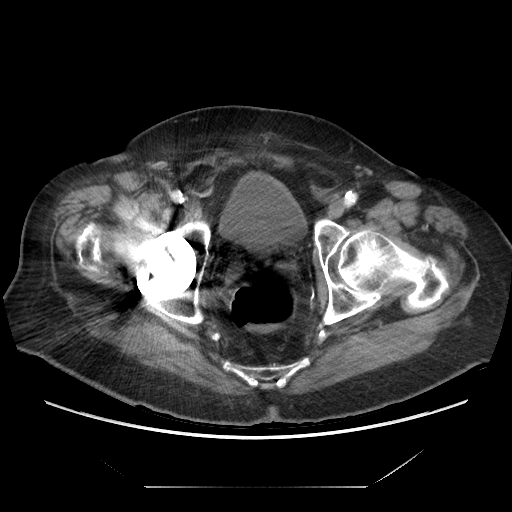
[im 34/100  soft-tissue]
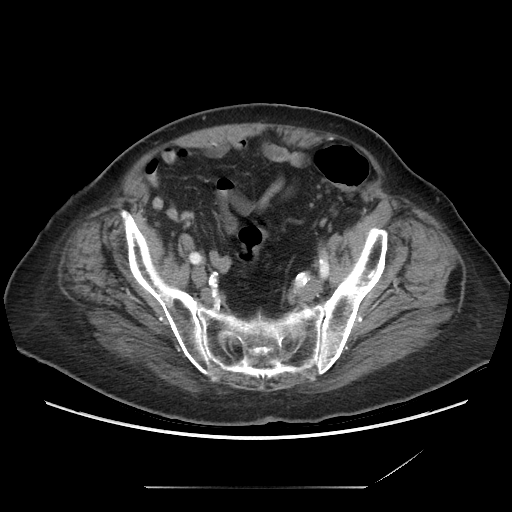
[im 45/100  soft-tissue]
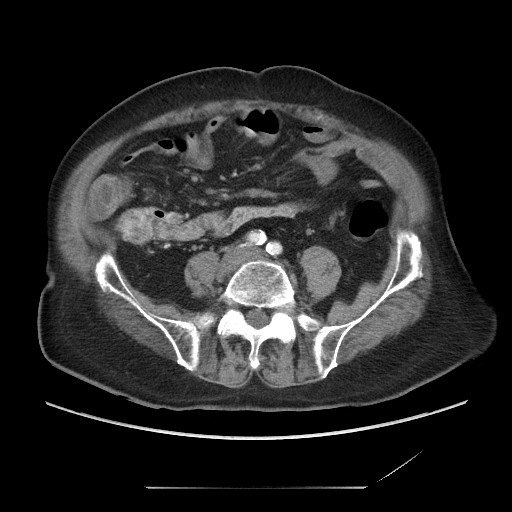
[im 56/100  soft-tissue]
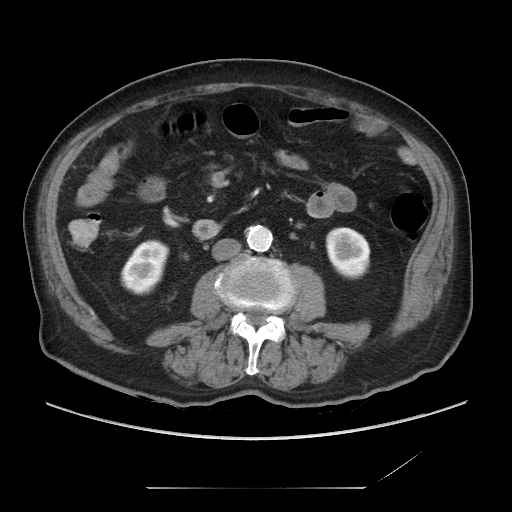
[im 56/100  lung]
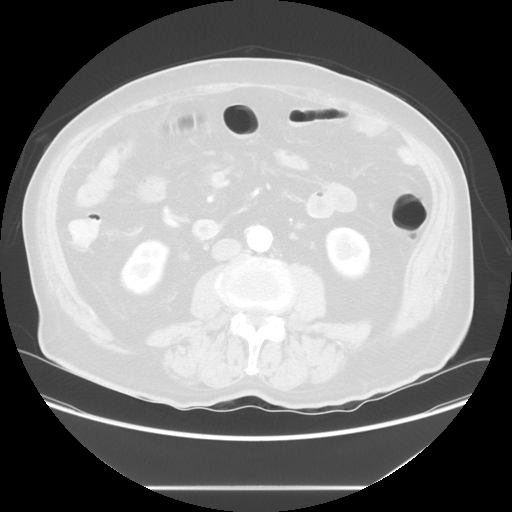
[im 67/100  soft-tissue]
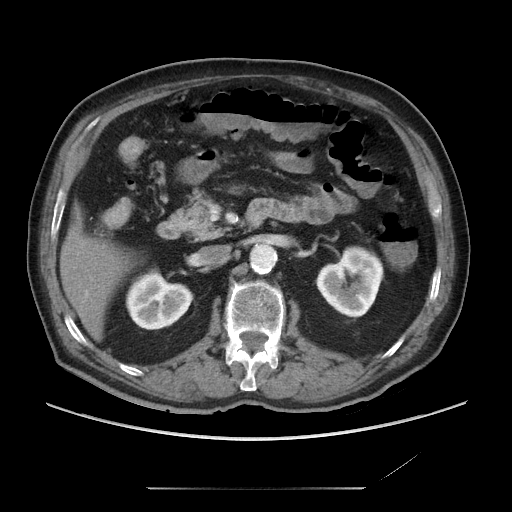
[im 67/100  lung]
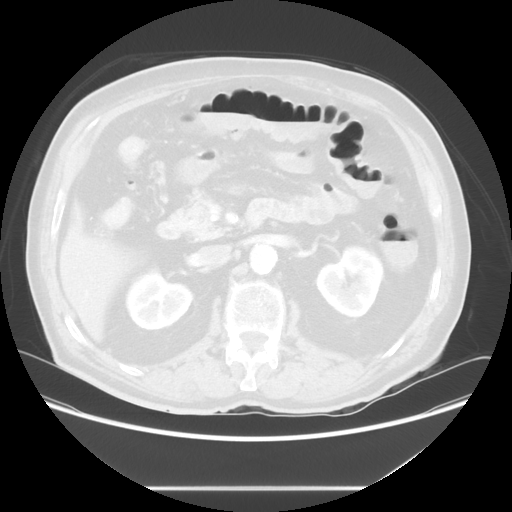
[im 78/100  soft-tissue]
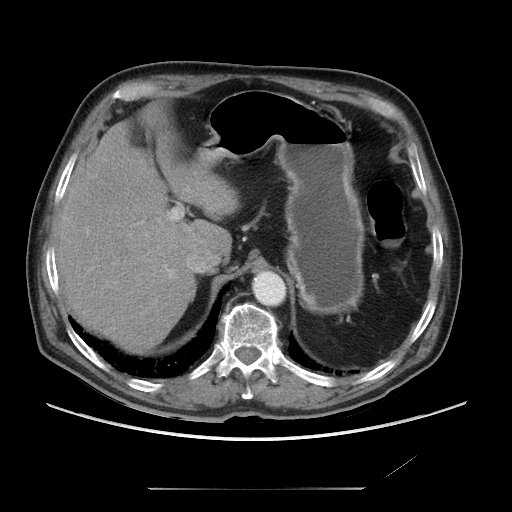
[im 78/100  lung]
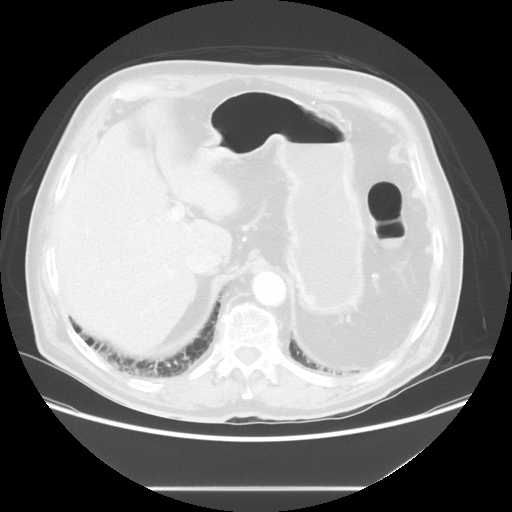
[im 89/100  soft-tissue]
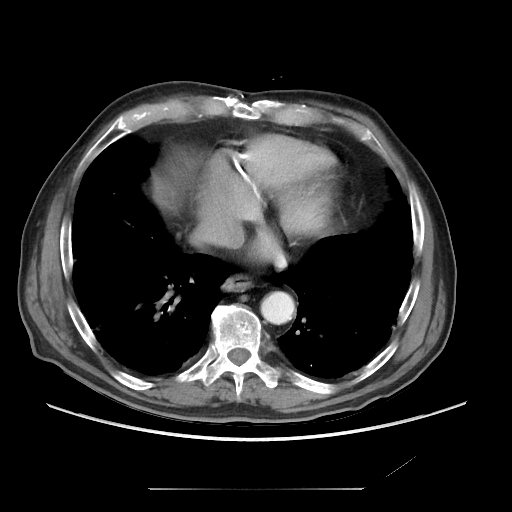
[im 89/100  lung]
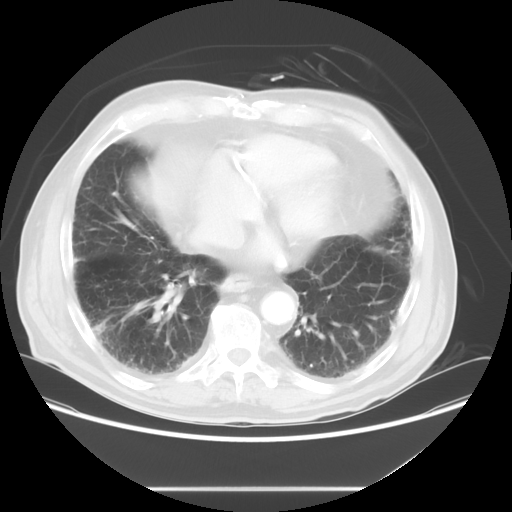

[Series 601: coronal body · coronal · 0.97mm/px · 1 of 121 slices shown, 2 images]
[im 41/121  soft-tissue]
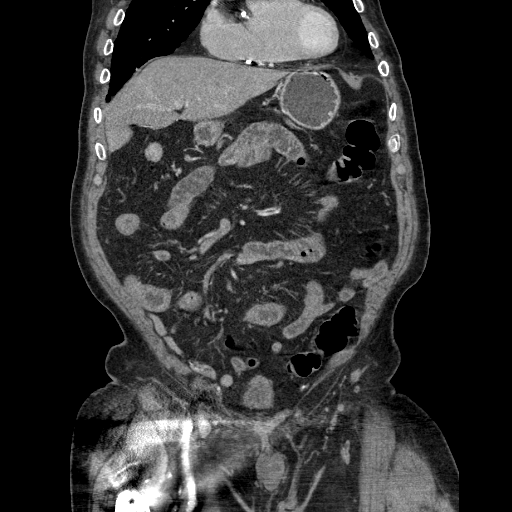
[im 41/121  bone]
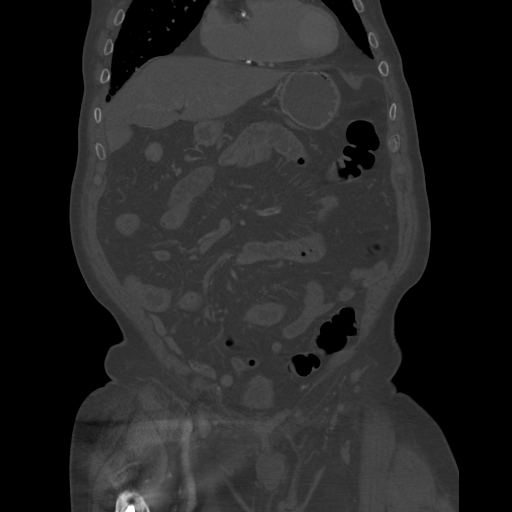

[Series 602: sagittal body · sagittal · 0.97mm/px · 3 of 166 slices shown]
[im 12/166  soft-tissue]
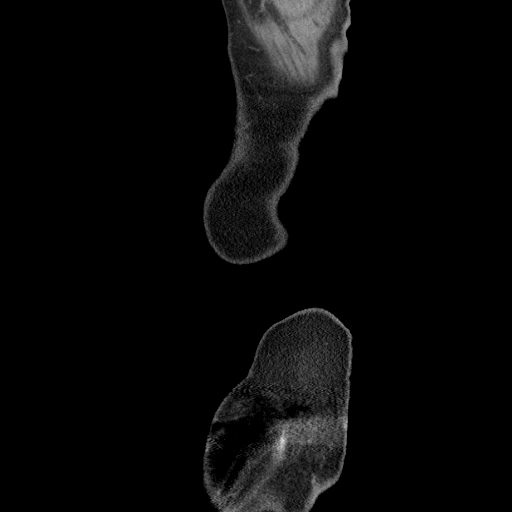
[im 34/166  soft-tissue]
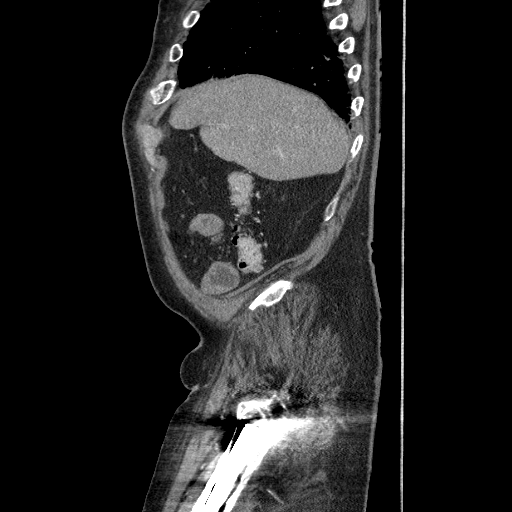
[im 56/166  soft-tissue]
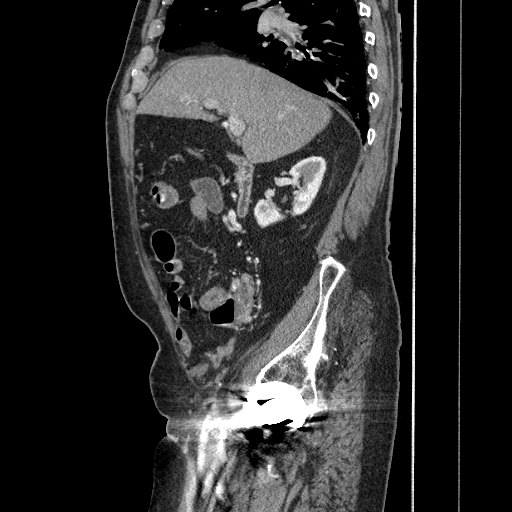

[12 of 36 positions shown; findings below may reference images not displayed]

FINDINGS: Lower chest: The lung bases are clear of acute process. Chronic
interstitial changes are noted. The heart is enlarged. No
pericardial effusion. Advanced atherosclerotic calcifications
involving the coronary arteries and descending thoracic aorta.

Hepatobiliary: No focal hepatic lesions or intrahepatic biliary
dilatation. The gallbladder surgically absent. No common bile duct
dilatation.

Pancreas: No mass, inflammation or ductal dilatation. Mild
pancreatic atrophy.

Spleen: Surgically absent.

Adrenals/Urinary Tract: The adrenal glands and kidneys are
unremarkable. A small lower pole left renal calculus is noted but no
obstructing ureteral calculi or bladder calculi.

Stomach/Bowel: The stomach is unremarkable. The duodenum bulb and
C-loop appear normal. There is a thickened loop of small bowel in
the lower mid abdomen and right lower quadrant. No obstructive
findings. Moderate scattered colonic diverticulosis without findings
for acute diverticulitis. Fluid is noted in the colon which can be
seen with diarrhea. The appendix is normal

Vascular/Lymphatic: Advanced atherosclerotic calcifications
involving the aorta and iliac arteries. Stable small saccular
aneurysm involving the infrarenal abdominal aorta just above the
iliac artery bifurcation. No dissection. The branch vessels are
patent. Dense calcification involving the IMA.

No mesenteric or retroperitoneal mass or adenopathy.

Reproductive: The prostate gland and seminal vesicles are grossly
normal.

Other: Bilateral inguinal hernias containing fat. No inguinal mass
or adenopathy.

Musculoskeletal: Total right hip arthroplasty noted. Remote
fractures involving the pubic rami. No acute bony findings or
destructive bony changes. Remote appearing L1 compression fracture.
IMPRESSION: 1. Thickened small bowel loop in the lower mid abdomen likely
inflammatory or infectious enteritis. No findings for obstruction.
2. Diffuse colonic diverticulosis without findings for acute
diverticulitis.
3. Status post cholecystectomy.  No biliary dilatation.
4. Advanced atherosclerotic calcifications involving the aorta and
branch vessels.

## 2019-05-05 ENCOUNTER — Other Ambulatory Visit: Payer: Self-pay | Admitting: Cardiovascular Disease

## 2019-06-07 ENCOUNTER — Other Ambulatory Visit: Payer: Self-pay

## 2019-06-07 ENCOUNTER — Ambulatory Visit (INDEPENDENT_AMBULATORY_CARE_PROVIDER_SITE_OTHER): Payer: Medicare Other | Admitting: Pharmacist Clinician (PhC)/ Clinical Pharmacy Specialist

## 2019-06-07 ENCOUNTER — Other Ambulatory Visit: Payer: Self-pay | Admitting: Pharmacist Clinician (PhC)/ Clinical Pharmacy Specialist

## 2019-06-07 DIAGNOSIS — I2699 Other pulmonary embolism without acute cor pulmonale: Secondary | ICD-10-CM | POA: Diagnosis not present

## 2019-06-07 DIAGNOSIS — Z7901 Long term (current) use of anticoagulants: Secondary | ICD-10-CM | POA: Diagnosis not present

## 2019-06-07 LAB — POCT INR: INR: 2.4 (ref 2.0–3.0)

## 2019-06-11 ENCOUNTER — Other Ambulatory Visit: Payer: Self-pay

## 2019-06-11 ENCOUNTER — Other Ambulatory Visit: Payer: Self-pay | Admitting: Cardiovascular Disease

## 2019-06-11 MED ORDER — HYDROCHLOROTHIAZIDE 12.5 MG PO CAPS: ORAL_CAPSULE | ORAL | 0 refills | Status: DC

## 2019-06-11 MED ORDER — ROSUVASTATIN CALCIUM 20 MG PO TABS: ORAL_TABLET | ORAL | 0 refills | Status: DC

## 2019-06-15 ENCOUNTER — Other Ambulatory Visit: Payer: Self-pay

## 2019-07-21 DIAGNOSIS — I10 Essential (primary) hypertension: Secondary | ICD-10-CM

## 2019-07-21 DIAGNOSIS — Z79899 Other long term (current) drug therapy: Secondary | ICD-10-CM

## 2019-07-21 DIAGNOSIS — I2510 Atherosclerotic heart disease of native coronary artery without angina pectoris: Secondary | ICD-10-CM

## 2019-07-21 DIAGNOSIS — E785 Hyperlipidemia, unspecified: Secondary | ICD-10-CM

## 2019-07-30 ENCOUNTER — Other Ambulatory Visit: Payer: Self-pay | Admitting: *Deleted

## 2019-07-30 DIAGNOSIS — E785 Hyperlipidemia, unspecified: Secondary | ICD-10-CM

## 2019-07-30 DIAGNOSIS — I2510 Atherosclerotic heart disease of native coronary artery without angina pectoris: Secondary | ICD-10-CM

## 2019-07-30 DIAGNOSIS — Z79899 Other long term (current) drug therapy: Secondary | ICD-10-CM

## 2019-07-30 DIAGNOSIS — I10 Essential (primary) hypertension: Secondary | ICD-10-CM

## 2019-07-30 DIAGNOSIS — Z7901 Long term (current) use of anticoagulants: Secondary | ICD-10-CM

## 2019-07-31 LAB — COMPREHENSIVE METABOLIC PANEL
AG Ratio: 1.3 (calc) (ref 1.0–2.5)
ALT: 11 U/L (ref 9–46)
AST: 18 U/L (ref 10–35)
Albumin: 4 g/dL (ref 3.6–5.1)
Alkaline phosphatase (APISO): 44 U/L (ref 35–144)
BUN/Creatinine Ratio: 15 (calc) (ref 6–22)
BUN: 21 mg/dL (ref 7–25)
CO2: 25 mmol/L (ref 20–32)
Calcium: 9.5 mg/dL (ref 8.6–10.3)
Chloride: 105 mmol/L (ref 98–110)
Creat: 1.44 mg/dL — ABNORMAL HIGH (ref 0.70–1.11)
Globulin: 3 g/dL (calc) (ref 1.9–3.7)
Glucose, Bld: 128 mg/dL — ABNORMAL HIGH (ref 65–99)
Potassium: 4.3 mmol/L (ref 3.5–5.3)
Sodium: 140 mmol/L (ref 135–146)
Total Bilirubin: 1.2 mg/dL (ref 0.2–1.2)
Total Protein: 7 g/dL (ref 6.1–8.1)

## 2019-07-31 LAB — LIPID PANEL
Cholesterol: 124 mg/dL (ref <200)
HDL: 33 mg/dL — ABNORMAL LOW (ref >=40)
LDL Cholesterol (Calc): 68 mg/dL (calc)
Non-HDL Cholesterol (Calc): 91 mg/dL (calc) (ref <130)
Total CHOL/HDL Ratio: 3.8 (calc) (ref <5.0)
Triglycerides: 155 mg/dL — ABNORMAL HIGH (ref <150)

## 2019-07-31 LAB — CBC
HCT: 46.9 % (ref 38.5–50.0)
Hemoglobin: 16 g/dL (ref 13.2–17.1)
MCH: 34.1 pg — ABNORMAL HIGH (ref 27.0–33.0)
MCHC: 34.1 g/dL (ref 32.0–36.0)
MCV: 100 fL (ref 80.0–100.0)
MPV: 10.9 fL (ref 7.5–12.5)
Platelets: 294 10*3/uL (ref 140–400)
RBC: 4.69 10*6/uL (ref 4.20–5.80)
RDW: 13.1 % (ref 11.0–15.0)
WBC: 9.5 10*3/uL (ref 3.8–10.8)

## 2019-07-31 LAB — TSH: TSH: 2.29 mIU/L (ref 0.40–4.50)

## 2019-08-02 ENCOUNTER — Encounter: Payer: Self-pay | Admitting: Cardiovascular Disease

## 2019-08-02 ENCOUNTER — Ambulatory Visit (INDEPENDENT_AMBULATORY_CARE_PROVIDER_SITE_OTHER): Payer: Medicare Other | Admitting: Cardiovascular Disease

## 2019-08-02 ENCOUNTER — Ambulatory Visit (INDEPENDENT_AMBULATORY_CARE_PROVIDER_SITE_OTHER): Payer: Medicare Other | Admitting: Pharmacist Clinician (PhC)/ Clinical Pharmacy Specialist

## 2019-08-02 ENCOUNTER — Other Ambulatory Visit: Payer: Self-pay

## 2019-08-02 VITALS — BP 115/62 | HR 68 | Temp 97.7°F | Ht 67.0 in | Wt 172.0 lb

## 2019-08-02 DIAGNOSIS — I4510 Unspecified right bundle-branch block: Secondary | ICD-10-CM

## 2019-08-02 DIAGNOSIS — M25472 Effusion, left ankle: Secondary | ICD-10-CM

## 2019-08-02 DIAGNOSIS — Z951 Presence of aortocoronary bypass graft: Secondary | ICD-10-CM

## 2019-08-02 DIAGNOSIS — Z7901 Long term (current) use of anticoagulants: Secondary | ICD-10-CM

## 2019-08-02 DIAGNOSIS — I10 Essential (primary) hypertension: Secondary | ICD-10-CM

## 2019-08-02 DIAGNOSIS — E785 Hyperlipidemia, unspecified: Secondary | ICD-10-CM

## 2019-08-02 DIAGNOSIS — I2699 Other pulmonary embolism without acute cor pulmonale: Secondary | ICD-10-CM

## 2019-08-02 DIAGNOSIS — I2510 Atherosclerotic heart disease of native coronary artery without angina pectoris: Secondary | ICD-10-CM | POA: Diagnosis not present

## 2019-08-02 LAB — POCT INR: INR: 2.8 (ref 2.0–3.0)

## 2019-08-02 NOTE — Patient Instructions (Signed)

## 2019-08-02 NOTE — Progress Notes (Signed)
Patient ID: Richard Mendez, male   DOB: [birth year], 86 y.o.   MRN: [id number]      HPI: Richard Mendez is a 86 y.o. male who presents to the office for a 24 month cardiology evaluation.  [name] has known CAD and underwent CABG surgery on 02/06/2006 by Dr.Van Tright with a LIMA  to the LAD, a vein to the diagonal, vein to the OM 2, and vein to the PDA. His last cardiac catheterization September 2008 showed that all grafts were patent. He did have left main occlusion and an 80% RCA stenosis involving the PLA vessel. He has a history of a right DVT with subsequent PE and has been on Coumadin anticoagulation.  Additional problems include a history of right bundle branch block, hypertension, and hyperlipidemia. He has occasional hip discomfort related to his prior hip replacement surgery in 1997. He admits to ankle swelling, left greater than right.  In 2015  his dose of Toprol was reduced from  25 mg to 12.5 mg daily when he had complaints of mild dizziness and was bradycardic.  His symptoms did improve.  He denies recent episodes of chest pain.  When I last saw him he was having indigestion symptoms.  At that time, he was having Tums frequently.  I initiated therapy with Protonix 40 mg, which has completely resolved his previous symptomatology.  He is on Coumadin anticoagulation.  When I  saw him in 2017, he was having intermittent gas and abdominal bloating recommended the addition of over-the-counter some methadone to his pantoprazole which he had been on chronically.  Over the past year, he denies any episodes of anginal type symptoms.  He admits to occasional swelling of his left ankle.  He has been having difficulty with his right hip and may ultimately require hip surgery in the future.  Recent laboratory prior to his April 2018 office visit:  Cholesterol was 138, triglycerides 166, HDL 33, VLDL 33 and LDL cholesterol 72.  One 10 and his creatinine was 1.19.  He had normal LFTs and a normal CBC.  TSH  was normal.    Since he may ultimately require hip surgery  I had recommended that he undergo a six-year follow-up nuclear study, which also could be used for preoperative clearance if necessary.  The nuclear study was done on 08/06/2016 and remained entirely normal with an EF of 62%.  There was suggestion of possible septal hypokinesis post CABG.  There are no ECG changes.  He had normal perfusion.    I last saw him in July 2019 Since his prior evaluation in 2018 he continued to be bothered by right hip discomfort. He had remotely undergone hip replacement  over 20 years ago.  He is requested a referral to see orthopedist since the orthopedist had retired and referral was made to Dr. Alvan Dame.   He denies any episodes of chest pain.  He denies any palpitations.  He denies presyncope or syncope.  He denies any PND orthopnea.  He has continued to be on anticoagulation with his remote history of DVT with subsequent PE.   Since I last saw him over the past 2 years he has continued to do fairly well from a cardiac standpoint.  He denies any anginal symptoms.  He continues to be on warfarin.  INR was obtained today and this was 2.8, therapeutic.  He has been taking hydrochlorothiazide daily for swelling.  He continues to be on rosuvastatin 20 mg daily for hyperlipidemia.  He  also has been on metoprolol succinate 12.5 mg.  He had undergone laboratory in July 30 2019 which showed a hemoglobin of 16 hematocrit 46.9.  Total cholesterol was 124 with an LDL cholesterol of 68.  There was mild triglyceride elevation at 155.  He presents for evaluation.  Past Medical History:  Diagnosis Date   CAD (coronary artery disease)    CABG   Colon cancer ([place]) 1997   surgery    History of DVT (deep vein thrombosis)    right   History of pulmonary embolism    History of stress test 09/2008   no change from previous, no significant ischemia, and EF of 68%   Hx of echocardiogram 11/07/2010   showed normal systolic  function, grade 2 diastolic dysfunction and also had abnormal tissue doppler suggesting increased LA pressure, left atrium was mildly dilated, mild to moderate mitral annular calcification with mild MR, moderate tricuspid regurgitation with moderate pulmonary hypertension with estimated RV systolic pressure of 41; trace AR and trace PR   Hyperlipidemia    Hypertension    RBBB (right bundle branch block)    S/P CABG (coronary artery bypass graft) 01/2006    Past Surgical History:  Procedure Laterality Date   ANKLE SURGERY  1978   ankle fracture   CARDIAC CATHETERIZATION  9/292008   severe native CAD with severe diffuse coronary calcif & total occlusion of L main & diffuse native RCA with 30% prox, 70-80% mid, diffused 50% stenosis before & after the crux, 80% distal RCA stenosis involving PLA; patent vein graft to PDA of RCA, patent vein graft to OM2 of Cfx, patient vein graft supplying diagonal, patent LIMA to mid-distal LAD (Dr. Corky Downs)   CHOLECYSTECTOMY  2003   COLONOSCOPY     CORONARY ARTERY BYPASS GRAFT  02/06/2006   LIMA placed to the LAD, a vein to the diagonal, a vein to the OM2 and a vein to the PDA. ([name]) - diagnostic cath by Dr. Corky Downs on 01/31/2006   HERNIA REPAIR  2006   NM Wilber  10/2010   lexiscan; normal patten of perfusion in all regions; post-stress EF 70%, occ PVCs throughout the study, low risk scan    SPLENECTOMY  1944   TRANSTHORACIC ECHOCARDIOGRAM  10/2010   EF=>55%, LA mildly dilated, RA mildly dilated; mild-mod mitral annular calcif, miild MR, mod TR, mod pulm HTN, RSVP 40-29mH; trace AV regurg, trace pulm valve regurg    Allergies  Allergen Reactions   Vesicare [Solifenacin]     Altered mental status   Morphine And Related Itching and Rash    Current Outpatient Medications  Medication Sig Dispense Refill   aspirin 81 MG tablet Take 81 mg by mouth daily.     finasteride (PROSCAR) 5 MG tablet Take 1 tablet by  mouth daily.     hydrochlorothiazide (MICROZIDE) 12.5 MG capsule TAKE 1 CAPSULE(12.5 MG) BY MOUTH DAILY. 90 capsule 0   pantoprazole (PROTONIX) 40 MG tablet TAKE 1 TABLET BY MOUTH EVERY DAY 30 tablet 11   Polyethylene Glycol 3350 (MIRALAX PO) Take by mouth. 1 cup full in the AM     rosuvastatin (CRESTOR) 20 MG tablet TAKE 1 TABLET(20 MG) BY MOUTH DAILY. 90 tablet 0   tamsulosin (FLOMAX) 0.4 MG CAPS Take 1 capsule by mouth daily.     metoprolol succinate (TOPROL-XL) 25 MG 24 hr tablet TAKE 1/2 TABLET BY MOUTH DAILY 15 tablet 11   warfarin (COUMADIN) 1 MG tablet TAKE  1 TO 2 TABLETS AS DIRECTED BY COUMADIN CLINIC 180 tablet 0   No current facility-administered medications for this visit.    Socially he is married has 4 children 3 grandchildren 4 great-grandchildren. There is no tobacco or alcohol use. He does not routinely exercise.  ROS General: Negative; No fevers, chills, or night sweats;  HEENT: Negative; No changes in vision or hearing, sinus congestion, difficulty swallowing Pulmonary: Negative; No cough, wheezing, shortness of breath, hemoptysis Cardiovascular: Negative; No chest pain, presyncope, syncope, palpitations GI:  Positive for indigestion GU: Negative; No dysuria, hematuria, or difficulty voiding Musculoskeletal: Negative; no myalgias, joint pain, or weakness Hematologic/Oncology: Remote history of splenomegaly at age 48. Endocrine: Negative; no heat/cold intolerance; no diabetes Neuro: Negative; no changes in balance, headaches Skin: Lipoma of his left tricep cyst in the back of his neck; No rashes or skin lesions Psychiatric: Negative; No behavioral problems, depression Sleep: Negative; No snoring, daytime sleepiness, hypersomnolence, bruxism, restless legs, hypnogognic hallucinations, no cataplexy Other comprehensive 14 point system review is negative.   PE BP 115/62    Pulse 68    Temp 97.7 F (36.5 C)    Ht _0  (1.702 m)    Wt 172 lb (78 kg)    SpO2 90%     BMI 26.94 kg/m    Repeat blood pressure by me was 112/64  Wt Readings from Last 3 Encounters:  08/02/19 172 lb (78 kg)  08/04/17 175 lb (79.4 kg)  08/20/16 181 lb 3.2 oz (82.2 kg)   General: Alert, oriented, no distress.  Skin: normal turgor, no rashes, warm and dry HEENT: Normocephalic, atraumatic. Pupils equal round and reactive to light; sclera anicteric; extraocular muscles intact;  Nose without nasal septal hypertrophy Mouth/Parynx benign; Mallinpatti scale 3 Neck: No JVD, no carotid bruits; normal carotid upstroke Lungs: clear to ausculatation and percussion; no wheezing or rales Chest wall: without tenderness to palpitation Heart: PMI not displaced, RRR, s1 s2 normal, 1/6 systolic murmur, no diastolic murmur, no rubs, gallops, thrills, or heaves Abdomen: soft, nontender; no hepatosplenomehaly, BS+; abdominal aorta nontender and not dilated by palpation. Back: no CVA tenderness Pulses 2+ Musculoskeletal: full range of motion, normal strength, no joint deformities Extremities: Trace left ankle edema no clubbing cyanosis, Homan's sign negative  Neurologic: grossly nonfocal; Cranial nerves grossly wnl Psychologic: Normal mood and affect    ECG (independently read by me): Probable sinus rhythm with frequent PACs.  Right bundle branch block with repolarization changes.  Left axis deviation.  QTc interval 463 ms first-degree AV block  August 04, 2017 ECG (independently read by me): Sinus rhythm with sinus arrhythmia.  Heart rate 60 bpm.  First-degree AV block with a PR interval of 248 ms.  Right bundle branch block with repolarization changes.  July 2018 ECG (independently read by me): Normal sinus rhythm at 66 bpm.  First-degree AV block with a PR interval at 274 ms.  Right bundle-branch block with repolarization changes.    April 2018 ECG (independently read by me): Normal sinus rhythm at 64 bpm.  Right bundle-branch block with repolarization changes.  First-degree AV block with a  PR interval at 284 ms.  May 2017 ECG (independently read by me): Sinus bradycardia 57 bpm.  First-degree AV block with a PR interval 250 ms.  Right bundle-branch block with repolarization changes.  November 2016 ECG (independently read by me): Sinus bradycardia 56 bpm.  First-degree AV block.  Right bundle-branch block.  April 2016 ECG (independently read by me):  Normal sinus rhythm  at 60 bpm.  Right bundle branch block with repolarization changes.  First-degree AV block with a PR interval at 248 ms.  October 2015ECG (independently read by me): Sinus bradycardia at 57 beats per minute.  Right bundle branch block with repolarization changes.  First degree AV block with a PR interval 226 ms.  Prior January 2015 ECG( independently interpreted by me): Normal sinus rhythm at 61 beats per minute with right bundle branch block and repolarization changes. Probable left anterior hemiblock. First degree AV block with PR interval at 2:30 milliseconds.  Prior ECG of 08/18/2012: Normal sinus rhythm at 59 beats per minute with first-degree AV block. Right bundle branch block with repolarization changes.  LABS:  BMP Latest Ref Rng & Units 07/30/2019 08/08/2017 07/23/2016  Glucose 65 - 99 mg/dL 128(H) 132(H) 127(H)  BUN 7 - 25 mg/dL _0 Creatinine 0.70 - 1.11 mg/dL 1.44(H) 1.28(H) 1.42(H)  BUN/Creat Ratio 6 - 22 (calc) _1 Sodium 135 - 146 mmol/L 140 138 138  Potassium 3.5 - 5.3 mmol/L 4.3 5.1 4.7  Chloride 98 - 110 mmol/L 105 102 102  CO2 20 - 32 mmol/L _2 Calcium 8.6 - 10.3 mg/dL 9.5 9.1 9.7    Hepatic Function Latest Ref Rng & Units 07/30/2019 08/08/2017 07/23/2016  Total Protein 6.1 - 8.1 g/dL 7.0 7.1 7.1  Albumin 3.2 - 4.6 g/dL - 3.5 4.0  AST 10 - 35 U/L _3 ALT 9 - 46 U/L _4 Alk Phosphatase 39 - 117 IU/L - 56 50  Total Bilirubin 0.2 - 1.2 mg/dL 1.2 1.1 1.0  Bilirubin, Direct - - - -    CBC Latest Ref Rng & Units 07/30/2019 08/08/2017 07/23/2016  WBC 3.8 - 10.8  Thousand/uL 9.5 6.9 9.5  Hemoglobin 13.2 - 17.1 g/dL 16.0 14.8 14.9  Hematocrit 38 - 50 % 46.9 43.4 43.6  Platelets 140 - 400 Thousand/uL 294 347 361   Lab Results  Component Value Date   MCV 100.0 07/30/2019   MCV 99 (H) 08/08/2017   MCV 98 (H) 07/23/2016      BNP    Component Value Date/Time   PROBNP 943.0 (H) 10/19/2006 1617      Lipid Panel     Component Value Date/Time   CHOL 124 07/30/2019 1048   CHOL 117 08/08/2017 0951   TRIG 155 (H) 07/30/2019 1048   HDL 33 (L) 07/30/2019 1048   HDL 22 (L) 08/08/2017 0951   CHOLHDL 3.8 07/30/2019 1048   VLDL 33 (H) 04/29/2016 1415   LDLCALC 68 07/30/2019 1048     RADIOLOGY: No results found.  IMPRESSION:  1. Coronary artery disease involving native coronary artery of native heart without angina pectoris   2. Hx of CABG   3. Essential hypertension   4. Hyperlipidemia with target LDL less than 70   5. RBBB   6. Long term current use of anticoagulant therapy   7. Left ankle swelling     ASSESSMENT AND PLAN: Mr. Ding is an 86 year-old male who is status post CABG revascularization surgery in January 2008.  At his last catheterization in September 2008 all grafts were patent.  He is on chronic Coumadin for his history of PE. He is not having any evidence for abnormal bleeding.  He continues to be without anginal symptomatology.  In July 2018 and nuclear perfusion study continued to be normal and did not reveal any scar or ischemia.  Presently,  his blood pressure is stable on Toprol-XL 12.5 mg daily in addition to hydrochlorothiazide 12.5 mg.  He is not having any anginal symptoms.  He does have trace left ankle edema.  His ECG shows occasional PACs.  If these continue he may be able to tolerate increase Toprol-XL to 25 mg but with his bundle branch block, first-degree AV block and somewhat low blood pressure particularly with his asymptomatic status I will continue current dosing of metoprolol.  His INR today is therapeutic at  2.8 on his current dose of warfarin.  He continues to be on rosuvastatin and 20 mg daily with most recent LDL cholesterol at 68.  However he did have mild triglyceride elevation at 155 and a low HDL at 33.  He will follow up with Dr. Kathryne Eriksson for his primary care.  I will see him in 1 year for cardiology reevaluation or sooner as needed.  Troy Sine, MD, [facility]  08/09/2019 5:16 PM

## 2019-08-05 ENCOUNTER — Other Ambulatory Visit: Payer: Self-pay | Admitting: Cardiovascular Disease

## 2019-08-05 ENCOUNTER — Telehealth: Payer: Self-pay | Admitting: Cardiovascular Disease

## 2019-08-05 NOTE — Telephone Encounter (Signed)
Dan from [employer] is calling to get approval on changing the dispenser on the patient's warfarin (COUMADIN) 1 MG tablet. A company by the name of [employer] dispenses the medicine, Linna Hoff is requesting change to [place]. Please advise.

## 2019-08-05 NOTE — Telephone Encounter (Signed)
Called the pharmacy and gave the ok to switch the manufacturer

## 2019-08-09 ENCOUNTER — Encounter: Payer: Self-pay | Admitting: Cardiovascular Disease

## 2019-08-30 ENCOUNTER — Other Ambulatory Visit: Payer: Self-pay | Admitting: Cardiovascular Disease

## 2019-09-06 ENCOUNTER — Other Ambulatory Visit: Payer: Self-pay | Admitting: Cardiovascular Disease

## 2019-09-30 ENCOUNTER — Other Ambulatory Visit: Payer: Self-pay

## 2019-09-30 ENCOUNTER — Ambulatory Visit (INDEPENDENT_AMBULATORY_CARE_PROVIDER_SITE_OTHER): Payer: Medicare Other

## 2019-09-30 DIAGNOSIS — Z7901 Long term (current) use of anticoagulants: Secondary | ICD-10-CM | POA: Diagnosis not present

## 2019-09-30 LAB — POCT INR: INR: 3.2 — AB (ref 2.0–3.0)

## 2019-09-30 NOTE — Patient Instructions (Signed)
Take only 1 tablet today and then Continue taking 1 tablet daily except 2 tablets every Monday and Thursday, repeat INR in 6 weeks.

## 2019-11-10 ENCOUNTER — Other Ambulatory Visit: Payer: Self-pay

## 2019-11-10 ENCOUNTER — Ambulatory Visit (INDEPENDENT_AMBULATORY_CARE_PROVIDER_SITE_OTHER): Payer: Medicare Other | Admitting: Pharmacist

## 2019-11-10 DIAGNOSIS — Z7901 Long term (current) use of anticoagulants: Secondary | ICD-10-CM | POA: Diagnosis not present

## 2019-11-10 LAB — POCT INR: INR: 3 (ref 2.0–3.0)

## 2019-11-15 ENCOUNTER — Other Ambulatory Visit (HOSPITAL_BASED_OUTPATIENT_CLINIC_OR_DEPARTMENT_OTHER): Payer: Self-pay | Admitting: Internal Medicine

## 2019-11-15 ENCOUNTER — Ambulatory Visit: Payer: Medicare Other | Attending: Internal Medicine

## 2019-11-15 DIAGNOSIS — Z23 Encounter for immunization: Secondary | ICD-10-CM

## 2019-11-15 NOTE — Progress Notes (Signed)
   Covid-19 Vaccination Clinic  Name:  Richard Mendez    MRN: [id number] DOB: 1925-08-23  11/15/2019  Mr. Taillon was observed post Covid-19 immunization for 15 minutes without incident. He was provided with Vaccine Information Sheet and instruction to access the V-Safe system. Vaccinated by Harriet Pho.  Mr. Hayashi was instructed to call 911 with any severe reactions post vaccine: [name] Difficulty breathing  . Swelling of face and throat  . A fast heartbeat  . A bad rash all over body  . Dizziness and weakness

## 2019-11-19 MED FILL — PFIZER-BIONTECH COVID-19 VA: 30 | 1 days supply | Qty: 0 | Fill #0

## 2019-11-30 ENCOUNTER — Other Ambulatory Visit: Payer: Self-pay | Admitting: Cardiovascular Disease

## 2019-12-22 ENCOUNTER — Ambulatory Visit (INDEPENDENT_AMBULATORY_CARE_PROVIDER_SITE_OTHER): Payer: Medicare Other

## 2019-12-22 ENCOUNTER — Other Ambulatory Visit: Payer: Self-pay

## 2019-12-22 DIAGNOSIS — Z7901 Long term (current) use of anticoagulants: Secondary | ICD-10-CM

## 2019-12-22 LAB — POCT INR: INR: 2.8 (ref 2.0–3.0)

## 2019-12-22 NOTE — Patient Instructions (Signed)
Continue taking 1 tablet daily except 2 tablets every Monday and Thursday, repeat INR in 6 weeks.  

## 2020-02-04 ENCOUNTER — Ambulatory Visit (INDEPENDENT_AMBULATORY_CARE_PROVIDER_SITE_OTHER): Payer: Medicare Other

## 2020-02-04 ENCOUNTER — Other Ambulatory Visit: Payer: Self-pay

## 2020-02-04 DIAGNOSIS — Z7901 Long term (current) use of anticoagulants: Secondary | ICD-10-CM | POA: Diagnosis not present

## 2020-02-04 LAB — POCT INR: INR: 2.9 (ref 2.0–3.0)

## 2020-02-04 NOTE — Patient Instructions (Signed)
Continue taking 1 tablet daily except 2 tablets every Monday and Thursday, repeat INR in 6 weeks.  

## 2020-03-01 ENCOUNTER — Other Ambulatory Visit: Payer: Self-pay | Admitting: Cardiovascular Disease

## 2020-03-15 ENCOUNTER — Ambulatory Visit (INDEPENDENT_AMBULATORY_CARE_PROVIDER_SITE_OTHER): Payer: Medicare Other

## 2020-03-15 ENCOUNTER — Other Ambulatory Visit: Payer: Self-pay

## 2020-03-15 DIAGNOSIS — Z7901 Long term (current) use of anticoagulants: Secondary | ICD-10-CM | POA: Diagnosis not present

## 2020-03-15 LAB — POCT INR: INR: 3.2 — AB (ref 2.0–3.0)

## 2020-03-15 NOTE — Patient Instructions (Signed)
Continue taking 1 tablet daily except 2 tablets every Monday and Thursday, repeat INR in 6 weeks. Eat coleslaw tonight or tomorrow.

## 2020-04-24 ENCOUNTER — Ambulatory Visit (INDEPENDENT_AMBULATORY_CARE_PROVIDER_SITE_OTHER): Payer: Medicare Other

## 2020-04-24 ENCOUNTER — Other Ambulatory Visit: Payer: Self-pay

## 2020-04-24 DIAGNOSIS — Z7901 Long term (current) use of anticoagulants: Secondary | ICD-10-CM

## 2020-04-24 LAB — POCT INR: INR: 2.5 (ref 2.0–3.0)

## 2020-04-24 NOTE — Patient Instructions (Signed)
Continue taking 1 tablet daily except 2 tablets every Monday and Thursday, repeat INR in 6 weeks.

## 2020-06-05 ENCOUNTER — Ambulatory Visit (INDEPENDENT_AMBULATORY_CARE_PROVIDER_SITE_OTHER): Payer: Medicare Other

## 2020-06-05 ENCOUNTER — Other Ambulatory Visit: Payer: Self-pay

## 2020-06-05 DIAGNOSIS — Z7901 Long term (current) use of anticoagulants: Secondary | ICD-10-CM

## 2020-06-05 LAB — POCT INR: INR: 2.1 (ref 2.0–3.0)

## 2020-06-05 NOTE — Patient Instructions (Signed)
Continue taking 1 tablet daily except 2 tablets every Monday and Thursday, repeat INR in 6 weeks.  

## 2020-06-08 ENCOUNTER — Other Ambulatory Visit: Payer: Self-pay | Admitting: Cardiovascular Disease

## 2020-07-17 ENCOUNTER — Ambulatory Visit (INDEPENDENT_AMBULATORY_CARE_PROVIDER_SITE_OTHER): Payer: Medicare Other | Admitting: Pharmacist

## 2020-07-17 ENCOUNTER — Ambulatory Visit (INDEPENDENT_AMBULATORY_CARE_PROVIDER_SITE_OTHER): Payer: Medicare Other | Admitting: Cardiovascular Disease

## 2020-07-17 ENCOUNTER — Other Ambulatory Visit: Payer: Self-pay

## 2020-07-17 ENCOUNTER — Encounter: Payer: Self-pay | Admitting: Cardiovascular Disease

## 2020-07-17 DIAGNOSIS — Z951 Presence of aortocoronary bypass graft: Secondary | ICD-10-CM | POA: Diagnosis not present

## 2020-07-17 DIAGNOSIS — Z7901 Long term (current) use of anticoagulants: Secondary | ICD-10-CM | POA: Diagnosis not present

## 2020-07-17 DIAGNOSIS — I4510 Unspecified right bundle-branch block: Secondary | ICD-10-CM

## 2020-07-17 DIAGNOSIS — E785 Hyperlipidemia, unspecified: Secondary | ICD-10-CM

## 2020-07-17 DIAGNOSIS — Z86711 Personal history of pulmonary embolism: Secondary | ICD-10-CM

## 2020-07-17 DIAGNOSIS — I10 Essential (primary) hypertension: Secondary | ICD-10-CM

## 2020-07-17 DIAGNOSIS — I2510 Atherosclerotic heart disease of native coronary artery without angina pectoris: Secondary | ICD-10-CM | POA: Diagnosis not present

## 2020-07-17 LAB — COMPREHENSIVE METABOLIC PANEL
ALT: 8 IU/L (ref 0–44)
AST: 19 IU/L (ref 0–40)
Albumin/Globulin Ratio: 1.4 (ref 1.2–2.2)
Albumin: 4.2 g/dL (ref 3.5–4.6)
Alkaline Phosphatase: 50 IU/L (ref 44–121)
BUN/Creatinine Ratio: 13 (ref 10–24)
BUN: 17 mg/dL (ref 10–36)
Bilirubin Total: 1.3 mg/dL — ABNORMAL HIGH (ref 0.0–1.2)
CO2: 22 mmol/L (ref 20–29)
Calcium: 9.7 mg/dL (ref 8.6–10.2)
Chloride: 104 mmol/L (ref 96–106)
Creatinine, Ser: 1.35 mg/dL — ABNORMAL HIGH (ref 0.76–1.27)
Globulin, Total: 3 g/dL (ref 1.5–4.5)
Glucose: 124 mg/dL — ABNORMAL HIGH (ref 65–99)
Potassium: 4.9 mmol/L (ref 3.5–5.2)
Sodium: 140 mmol/L (ref 134–144)
Total Protein: 7.2 g/dL (ref 6.0–8.5)
eGFR: 49 mL/min/{1.73_m2} — ABNORMAL LOW (ref >59)

## 2020-07-17 LAB — LIPID PANEL
Chol/HDL Ratio: 3.9 ratio (ref 0.0–5.0)
Cholesterol, Total: 124 mg/dL (ref 100–199)
HDL: 32 mg/dL — ABNORMAL LOW (ref >39)
LDL Chol Calc (NIH): 66 mg/dL (ref 0–99)
Triglycerides: 148 mg/dL (ref 0–149)
VLDL Cholesterol Cal: 26 mg/dL (ref 5–40)

## 2020-07-17 LAB — CBC
Hematocrit: 44.3 % (ref 37.5–51.0)
Hemoglobin: 15.5 g/dL (ref 13.0–17.7)
MCH: 34.7 pg — ABNORMAL HIGH (ref 26.6–33.0)
MCHC: 35 g/dL (ref 31.5–35.7)
MCV: 99 fL — ABNORMAL HIGH (ref 79–97)
Platelets: 314 10*3/uL (ref 150–450)
RBC: 4.47 x10E6/uL (ref 4.14–5.80)
RDW: 13.1 % (ref 11.6–15.4)
WBC: 10 10*3/uL (ref 3.4–10.8)

## 2020-07-17 LAB — TSH: TSH: 3.14 u[IU]/mL (ref 0.450–4.500)

## 2020-07-17 LAB — POCT INR: INR: 2.5 (ref 2.0–3.0)

## 2020-07-17 NOTE — Progress Notes (Signed)
Patient ID: Richard Mendez, male   DOB: Dec 15, 1925, 87 y.o.   MRN: [id number]      HPI: Richard Mendez is a 87 y.o. male who presents to the office for a 24 month cardiology evaluation.  Richard Mendez has known CAD and underwent CABG surgery on 02/06/2006 by Dr.Van Mendez with a LIMA  to the LAD, a vein to the diagonal, vein to the OM 2, and vein to the PDA. His last cardiac catheterization September 2008 showed that all grafts were patent. He did have left main occlusion and an 80% RCA stenosis involving the PLA vessel. He has a history of a right DVT with subsequent PE and has been on Coumadin anticoagulation.  Additional problems include a history of right bundle branch block, hypertension, and hyperlipidemia. He has occasional hip discomfort related to his prior hip replacement surgery in 1997. He admits to ankle swelling, left greater than right.  In 2015  his dose of Toprol was reduced from  25 mg to 12.5 mg daily when he had complaints of mild dizziness and was bradycardic.  His symptoms did improve.  He denies recent episodes of chest pain.  When I last saw him he was having indigestion symptoms.  At that time, he was having Tums frequently.  I initiated therapy with Protonix 40 mg, which has completely resolved his previous symptomatology.  He is on Coumadin anticoagulation.  When I  saw him in 2017, he was having intermittent gas and abdominal bloating recommended the addition of over-the-counter some methadone to his pantoprazole which he had been on chronically.  Over the past year, he denies any episodes of anginal type symptoms.  He admits to occasional swelling of his left ankle.  He has been having difficulty with his right hip and may ultimately require hip surgery in the future.  Recent laboratory prior to his April 2018 office visit:  Cholesterol was 138, triglycerides 166, HDL 33, VLDL 33 and LDL cholesterol 72.  One 10 and his creatinine was 1.19.  He had normal LFTs and a normal CBC.  TSH  was normal.    Since he may ultimately require hip surgery  I had recommended that he undergo a six-year follow-up nuclear study, which also could be used for preoperative clearance if necessary.  The nuclear study was done on 08/06/2016 and remained entirely normal with an EF of 62%.  There was suggestion of possible septal hypokinesis post CABG.  There are no ECG changes.  He had normal perfusion.    I saw him in July 2019 Since his prior evaluation in 2018 he continued to be bothered by right hip discomfort. He had remotely undergone hip replacement  over 20 years ago.  He is requested a referral to see orthopedist since the orthopedist had retired and referral was made to Dr. Alvan Mendez.   He denies any episodes of chest pain.  He denies any palpitations.  He denies presyncope or syncope.  He denies any PND orthopnea.  He has continued to be on anticoagulation with his remote history of DVT with subsequent PE.   I last saw him and over the 2 previous years he continued to do fairly well from a cardiac standpoint.   He denied any anginal symptoms.  He continues to be on warfarin.  INR was obtained  and this was 2.8, therapeutic.  He has been taking hydrochlorothiazide daily for swelling.  He was on rosuvastatin 20 mg daily for hyperlipidemia.  He also has been  on metoprolol succinate 12.5 mg.  He had undergone laboratory in July 30 2019 which showed a hemoglobin of 16 hematocrit 46.9.  Total cholesterol was 124 with an LDL cholesterol of 68.  There was mild triglyceride elevation at 155.    Since I last saw him, he has remained fairly stable.  He specifically denies any chest pain or shortness of breath.  He has continued to be on metoprolol succinate 12.5 mg, HCTZ 12.5 mg for hypertension.  He is on warfarin 2 mg on Monday and Thursday and 1 mg all other days.  He has been taking aspirin 81 mg in addition to warfarin.  He is on pantoprazole for GERD and finasteride for prostate issues.  He denies significant  edema.  He presents for evaluation.  Past Medical History:  Diagnosis Date   CAD (coronary artery disease)    CABG   Colon cancer ([place]) 1997   surgery    History of DVT (deep vein thrombosis)    right   History of pulmonary embolism    History of stress test 09/2008   no change from previous, no significant ischemia, and EF of 68%   Hx of echocardiogram 11/07/2010   showed normal systolic function, grade 2 diastolic dysfunction and also had abnormal tissue doppler suggesting increased LA pressure, left atrium was mildly dilated, mild to moderate mitral annular calcification with mild MR, moderate tricuspid regurgitation with moderate pulmonary hypertension with estimated RV systolic pressure of 41; trace AR and trace PR   Hyperlipidemia    Hypertension    RBBB (right bundle branch block)    S/P CABG (coronary artery bypass graft) 01/2006    Past Surgical History:  Procedure Laterality Date   ANKLE SURGERY  1978   ankle fracture   CARDIAC CATHETERIZATION  9/292008   severe native CAD with severe diffuse coronary calcif & total occlusion of L main & diffuse native RCA with 30% prox, 70-80% mid, diffused 50% stenosis before & after the crux, 80% distal RCA stenosis involving PLA; patent vein graft to PDA of RCA, patent vein graft to OM2 of Cfx, patient vein graft supplying diagonal, patent LIMA to mid-distal LAD (Dr. Corky Downs)   CHOLECYSTECTOMY  2003   COLONOSCOPY     CORONARY ARTERY BYPASS GRAFT  02/06/2006   LIMA placed to the LAD, a vein to the diagonal, a vein to the OM2 and a vein to the PDA. ([name]) - diagnostic cath by Dr. Corky Downs on 01/31/2006   HERNIA REPAIR  2006   [facility] [place]  10/2010   lexiscan; normal patten of perfusion in all regions; post-stress EF 70%, occ PVCs throughout the study, low risk scan    SPLENECTOMY  1944   TRANSTHORACIC ECHOCARDIOGRAM  10/2010   EF=>55%, LA mildly dilated, RA mildly dilated; mild-mod mitral annular calcif, miild MR,  mod TR, mod pulm HTN, RSVP 40-41mH; trace AV regurg, trace pulm valve regurg    Allergies  Allergen Reactions   Vesicare [Solifenacin]     Altered mental status   Morphine And Related Itching and Rash    Current Outpatient Medications  Medication Sig Dispense Refill   COVID-19 mRNA vaccine, Pfizer, 30 MCG/0.3ML injection INJECT AS DIRECTED .3 mL 0   finasteride (PROSCAR) 5 MG tablet Take 1 tablet by mouth daily.     hydrochlorothiazide (MICROZIDE) 12.5 MG capsule TAKE 1 CAPSULE(12.5 MG) BY MOUTH DAILY. 90 capsule 3   metoprolol succinate (TOPROL-XL) 25 MG 24 hr tablet  TAKE 1/2 TABLET BY MOUTH DAILY 15 tablet 11   pantoprazole (PROTONIX) 40 MG tablet TAKE 1 TABLET BY MOUTH EVERY DAY 30 tablet 11   Polyethylene Glycol 3350 (MIRALAX PO) Take by mouth. 1 cup full in the AM     rosuvastatin (CRESTOR) 20 MG tablet TAKE 1 TABLET(20 MG) BY MOUTH DAILY 90 tablet 2   tamsulosin (FLOMAX) 0.4 MG CAPS Take 1 capsule by mouth daily.     warfarin (COUMADIN) 1 MG tablet TAKE 1 TO 2 TABLET AS DIRECTED BY COUMADIN CLINIC 180 tablet 0   No current facility-administered medications for this visit.    Socially he is married has 4 children 3 grandchildren 4 great-grandchildren. There is no tobacco or alcohol use. He does not routinely exercise.  ROS General: Negative; No fevers, chills, or night sweats;  HEENT: Negative; No changes in vision or hearing, sinus congestion, difficulty swallowing Pulmonary: Negative; No cough, wheezing, shortness of breath, hemoptysis Cardiovascular: Negative; No chest pain, presyncope, syncope, palpitations GI:  Positive for indigestion GU: Negative; No dysuria, hematuria, or difficulty voiding Musculoskeletal: Negative; no myalgias, joint pain, or weakness Hematologic/Oncology: Remote history of splenomegaly at age 89. Endocrine: Negative; no heat/cold intolerance; no diabetes Neuro: Negative; no changes in balance, headaches Skin: Lipoma of his left tricep cyst in  the back of his neck; No rashes or skin lesions Psychiatric: Negative; No behavioral problems, depression Sleep: Negative; No snoring, daytime sleepiness, hypersomnolence, bruxism, restless legs, hypnogognic hallucinations, no cataplexy Other comprehensive 14 point system review is negative.   PE BP (!) 124/54 (BP Location: Left Arm, Patient Position: Sitting, Cuff Size: Normal)   Pulse 64   Resp 18   Ht '5\' 7"'  (1.702 m)   Wt 166 lb (75.3 kg)   SpO2 94%   BMI 26.00 kg/m    Repeat blood pressure by me was 118/60  Wt Readings from Last 3 Encounters:  07/17/20 166 lb (75.3 kg)  08/02/19 172 lb (78 kg)  08/04/17 175 lb (79.4 kg)   General: Alert, oriented, no distress.  Skin: normal turgor, no rashes, warm and dry HEENT: Normocephalic, atraumatic. Pupils equal round and reactive to light; sclera anicteric; extraocular muscles intact;  Nose without nasal septal hypertrophy Mouth/Parynx benign; Mallinpatti scale 3 Neck: No JVD, no carotid bruits; normal carotid upstroke Lungs: clear to ausculatation and percussion; no wheezing or rales Chest wall: without tenderness to palpitation Heart: PMI not displaced, RRR, s1 s2 normal, 1/6 systolic murmur, no diastolic murmur, no rubs, gallops, thrills, or heaves Abdomen: soft, nontender; no hepatosplenomehaly, BS+; abdominal aorta nontender and not dilated by palpation. Back: no CVA tenderness Pulses 2+ Musculoskeletal: full range of motion, normal strength, no joint deformities Extremities: no clubbing cyanosis or edema, Homan's sign negative  Neurologic: grossly nonfocal; Cranial nerves grossly wnl Psychologic: Normal mood and affect    ECG (independently read by me): Sinus rhythm with PACs, PVC, RBBB, QTc 476 msec  August 11, 2019 ECG (independently read by me): Probable sinus rhythm with frequent PACs.  Right bundle branch block with repolarization changes.  Left axis deviation.  QTc interval 463 ms first-degree AV block  August 04, 2017  ECG (independently read by me): Sinus rhythm with sinus arrhythmia.  Heart rate 60 bpm.  First-degree AV block with a PR interval of 248 ms.  Right bundle branch block with repolarization changes.  July 2018 ECG (independently read by me): Normal sinus rhythm at 66 bpm.  First-degree AV block with a PR interval at 274 ms.  Right  bundle-branch block with repolarization changes.    April 2018 ECG (independently read by me): Normal sinus rhythm at 64 bpm.  Right bundle-branch block with repolarization changes.  First-degree AV block with a PR interval at 284 ms.  May 2017 ECG (independently read by me): Sinus bradycardia 57 bpm.  First-degree AV block with a PR interval 250 ms.  Right bundle-branch block with repolarization changes.  November 2016 ECG (independently read by me): Sinus bradycardia 56 bpm.  First-degree AV block.  Right bundle-branch block.  April 2016 ECG (independently read by me):  Normal sinus rhythm at 60 bpm.  Right bundle branch block with repolarization changes.  First-degree AV block with a PR interval at 248 ms.  October 2015ECG (independently read by me): Sinus bradycardia at 57 beats per minute.  Right bundle branch block with repolarization changes.  First degree AV block with a PR interval 226 ms.  Prior January 2015 ECG( independently interpreted by me): Normal sinus rhythm at 61 beats per minute with right bundle branch block and repolarization changes. Probable left anterior hemiblock. First degree AV block with PR interval at 2:30 milliseconds.  Prior ECG of 08/18/2012: Normal sinus rhythm at 59 beats per minute with first-degree AV block. Right bundle branch block with repolarization changes.  LABS:  BMP Latest Ref Rng & Units 07/30/2019 08/08/2017 07/23/2016  Glucose 65 - 99 mg/dL 128(H) 132(H) 127(H)  BUN 7 - 25 mg/dL '21 19 20  ' Creatinine 0.70 - 1.11 mg/dL 1.44(H) 1.28(H) 1.42(H)  BUN/Creat Ratio 6 - 22 (calc) '15 15 14  ' Sodium 135 - 146 mmol/L 140 138 138   Potassium 3.5 - 5.3 mmol/L 4.3 5.1 4.7  Chloride 98 - 110 mmol/L 105 102 102  CO2 20 - 32 mmol/L '25 22 23  ' Calcium 8.6 - 10.3 mg/dL 9.5 9.1 9.7    Hepatic Function Latest Ref Rng & Units 07/30/2019 08/08/2017 07/23/2016  Total Protein 6.1 - 8.1 g/dL 7.0 7.1 7.1  Albumin 3.2 - 4.6 g/dL - 3.5 4.0  AST 10 - 35 U/L '18 21 21  ' ALT 9 - 46 U/L '11 18 14  ' Alk Phosphatase 39 - 117 IU/L - 56 50  Total Bilirubin 0.2 - 1.2 mg/dL 1.2 1.1 1.0  Bilirubin, Direct - - - -    CBC Latest Ref Rng & Units 07/30/2019 08/08/2017 07/23/2016  WBC 3.8 - 10.8 Thousand/uL 9.5 6.9 9.5  Hemoglobin 13.2 - 17.1 g/dL 16.0 14.8 14.9  Hematocrit 38.5 - 50.0 % 46.9 43.4 43.6  Platelets 140 - 400 Thousand/uL 294 347 361   Lab Results  Component Value Date   MCV 100.0 07/30/2019   MCV 99 (H) 08/08/2017   MCV 98 (H) 07/23/2016      BNP    Component Value Date/Time   PROBNP 943.0 (H) 10/19/2006 1617      Lipid Panel     Component Value Date/Time   CHOL 124 07/30/2019 1048   CHOL 117 08/08/2017 0951   TRIG 155 (H) 07/30/2019 1048   HDL 33 (L) 07/30/2019 1048   HDL 22 (L) 08/08/2017 0951   CHOLHDL 3.8 07/30/2019 1048   VLDL 33 (H) 04/29/2016 1415   LDLCALC 68 07/30/2019 1048     RADIOLOGY: No results found.  IMPRESSION:  1. CAD in native artery   2. Hx of CABG   3. Hyperlipidemia with target LDL less than 70   4. Long term current use of anticoagulant therapy   5. RBBB   6. Essential hypertension  7. History of pulmonary embolism     ASSESSMENT AND PLAN: Mr. Tallman is an 87 year-old male who is status post CABG revascularization surgery in January 2008.  At his last catheterization in September 2008 all grafts were patent.  He is on chronic Coumadin for his history of PE. He is not having any evidence for abnormal bleeding.  He continues to be without anginal symptomatology.  In July 2018 a nuclear perfusion study continued to be normal and did not reveal any scar or ischemia.  His blood pressure  today is stable.  ECG shows sinus rhythm with PACs, PVC, and right bundle branch block.  He is scheduled to have an INR check today.  With his age of 40 years I have recommended he discontinue aspirin therapy.  I discussed the bleed risk relative to the potential benefit.  He is not having any anginal symptomatology.  He has not had recent laboratory.  He continues to be on rosuvastatin for hyperlipidemia with target LDL less than 70.  He sees Dr. Kathryne Eriksson for his primary care.  I will check a comprehensive metabolic panel, CBC, TSH and lipid studies.  Pharmacy will be checking his warfarin and adjust dose if necessary.  As long as he continues to feel well I will see him in 1 year for reevaluation or sooner as needed.   Troy Sine, MD, [facility]  07/17/2020 2:37 PM

## 2020-07-17 NOTE — Patient Instructions (Signed)
Medication Instructions:  STOP aspirin  *If you need a refill on your cardiac medications before your next appointment, please call your pharmacy*   Lab Work: CMET, Lipid, CBC, TSH to be drawn today.   If you have labs (blood work) drawn today and your tests are completely normal, you will receive your results only by: [place] (if you have MyChart) OR A paper copy in the mail If you have any lab test that is abnormal or we need to change your treatment, we will call you to review the results.   Testing/Procedures: None ordered.    Follow-Up: At [facility], you and your health needs are our priority.  As part of our continuing mission to provide you with exceptional heart care, we have created designated Provider Care Teams.  These Care Teams include your primary Cardiologist (physician) and Advanced Practice Providers (APPs -  Physician Assistants and Nurse Practitioners) who all work together to provide you with the care you need, when you need it.  We recommend signing up for the patient portal called "MyChart".  Sign up information is provided on this After Visit Summary.  MyChart is used to connect with patients for Virtual Visits (Telemedicine).  Patients are able to view lab/test results, encounter notes, upcoming appointments, etc.  Non-urgent messages can be sent to your provider as well.   To learn more about what you can do with MyChart, go to [email or web address].    Your next appointment:   12 month(s)  The format for your next appointment:   In Person  Provider:   Shelva Majestic, MD

## 2020-08-21 ENCOUNTER — Encounter: Payer: Self-pay | Admitting: Pharmacist

## 2020-09-04 ENCOUNTER — Other Ambulatory Visit: Payer: Self-pay | Admitting: Cardiovascular Disease

## 2020-09-04 ENCOUNTER — Other Ambulatory Visit: Payer: Self-pay

## 2020-09-11 ENCOUNTER — Other Ambulatory Visit: Payer: Self-pay

## 2020-09-11 ENCOUNTER — Ambulatory Visit (INDEPENDENT_AMBULATORY_CARE_PROVIDER_SITE_OTHER): Payer: Medicare Other

## 2020-09-11 DIAGNOSIS — Z5181 Encounter for therapeutic drug level monitoring: Secondary | ICD-10-CM | POA: Diagnosis not present

## 2020-09-11 DIAGNOSIS — I2699 Other pulmonary embolism without acute cor pulmonale: Secondary | ICD-10-CM

## 2020-09-11 DIAGNOSIS — Z7901 Long term (current) use of anticoagulants: Secondary | ICD-10-CM

## 2020-09-11 LAB — POCT INR: INR: 2.8 (ref 2.0–3.0)

## 2020-09-11 NOTE — Patient Instructions (Signed)
Description   Continue taking 1 tablet daily except 2 tablets every Monday and Thursday, repeat INR in 7 weeks.

## 2020-10-30 ENCOUNTER — Ambulatory Visit (INDEPENDENT_AMBULATORY_CARE_PROVIDER_SITE_OTHER): Payer: Medicare Other

## 2020-10-30 ENCOUNTER — Other Ambulatory Visit: Payer: Self-pay

## 2020-10-30 DIAGNOSIS — Z7901 Long term (current) use of anticoagulants: Secondary | ICD-10-CM | POA: Diagnosis not present

## 2020-10-30 LAB — POCT INR: INR: 2.7 (ref 2.0–3.0)

## 2020-10-30 NOTE — Patient Instructions (Signed)
Continue taking 1 tablet daily except 2 tablets every Monday and Thursday, repeat INR in 8 weeks.

## 2020-11-07 ENCOUNTER — Ambulatory Visit: Payer: Medicare Other | Attending: Internal Medicine

## 2020-11-07 DIAGNOSIS — Z23 Encounter for immunization: Secondary | ICD-10-CM

## 2020-11-07 NOTE — Progress Notes (Signed)
   Covid-19 Vaccination Clinic  Name:  Richard Mendez    MRN: [id number] DOB: Jul 10, 1925  11/07/2020  [name] was observed post Covid-19 immunization for 15 minutes without incident. He was provided with Vaccine Information Sheet and instruction to access the V-Safe system.   [name] was instructed to call 911 with any severe reactions post vaccine: Difficulty breathing  Swelling of face and throat  A fast heartbeat  A bad rash all over body  Dizziness and weakness

## 2020-12-05 ENCOUNTER — Other Ambulatory Visit (HOSPITAL_BASED_OUTPATIENT_CLINIC_OR_DEPARTMENT_OTHER): Payer: Self-pay

## 2020-12-05 ENCOUNTER — Other Ambulatory Visit: Payer: Self-pay | Admitting: Cardiovascular Disease

## 2020-12-05 MED ORDER — PFIZER COVID-19 VAC BIVALENT 30 MCG/0.3ML IM SUSP
INTRAMUSCULAR | 0 refills | Status: AC
  Filled 2020-12-05: qty 0.3, 1d supply, fill #0

## 2020-12-05 NOTE — Telephone Encounter (Signed)
Prescription refill request received for warfarin Lov: 07/17/20 Claiborne Billings)  Next INR check: 12/5 Warfarin tablet strength: 1mg   Appropriate dose and refill sent to requested pharmacy.

## 2020-12-25 ENCOUNTER — Other Ambulatory Visit: Payer: Self-pay

## 2020-12-25 ENCOUNTER — Ambulatory Visit (INDEPENDENT_AMBULATORY_CARE_PROVIDER_SITE_OTHER): Payer: Medicare Other

## 2020-12-25 DIAGNOSIS — Z7901 Long term (current) use of anticoagulants: Secondary | ICD-10-CM | POA: Diagnosis not present

## 2020-12-25 LAB — POCT INR: INR: 2.6 (ref 2.0–3.0)

## 2020-12-25 NOTE — Patient Instructions (Signed)
Continue taking 1 tablet daily except 2 tablets every Monday and Thursday, repeat INR in 8 weeks.

## 2021-02-19 ENCOUNTER — Other Ambulatory Visit: Payer: Self-pay

## 2021-02-19 ENCOUNTER — Ambulatory Visit (INDEPENDENT_AMBULATORY_CARE_PROVIDER_SITE_OTHER): Payer: Medicare Other

## 2021-02-19 DIAGNOSIS — Z7901 Long term (current) use of anticoagulants: Secondary | ICD-10-CM | POA: Diagnosis not present

## 2021-02-19 LAB — POCT INR: INR: 3.1 — AB (ref 2.0–3.0)

## 2021-02-19 NOTE — Patient Instructions (Signed)
Continue taking 1 tablet daily except 2 tablets every Monday and Thursday, repeat INR in 8 weeks. Eat greens tonight

## 2021-03-12 ENCOUNTER — Other Ambulatory Visit: Payer: Self-pay | Admitting: Cardiovascular Disease

## 2021-04-16 ENCOUNTER — Ambulatory Visit (INDEPENDENT_AMBULATORY_CARE_PROVIDER_SITE_OTHER): Payer: Medicare Other

## 2021-04-16 ENCOUNTER — Other Ambulatory Visit: Payer: Self-pay

## 2021-04-16 DIAGNOSIS — Z7901 Long term (current) use of anticoagulants: Secondary | ICD-10-CM | POA: Diagnosis not present

## 2021-04-16 LAB — POCT INR: INR: 2.7 (ref 2.0–3.0)

## 2021-04-16 NOTE — Patient Instructions (Signed)
Continue taking 1 tablet daily except 2 tablets every Monday and Thursday, repeat INR in 8 weeks.  ?

## 2021-06-06 ENCOUNTER — Other Ambulatory Visit: Payer: Self-pay | Admitting: Cardiovascular Disease

## 2021-06-11 ENCOUNTER — Ambulatory Visit (INDEPENDENT_AMBULATORY_CARE_PROVIDER_SITE_OTHER): Payer: Medicare Other

## 2021-06-11 DIAGNOSIS — Z7901 Long term (current) use of anticoagulants: Secondary | ICD-10-CM | POA: Diagnosis not present

## 2021-06-11 LAB — POCT INR: INR: 2.8 (ref 2.0–3.0)

## 2021-06-11 NOTE — Patient Instructions (Signed)
Continue taking 1 tablet daily except 2 tablets every Monday and Thursday, repeat INR in 8 weeks.

## 2021-08-06 ENCOUNTER — Ambulatory Visit (INDEPENDENT_AMBULATORY_CARE_PROVIDER_SITE_OTHER): Payer: Medicare Other

## 2021-08-06 DIAGNOSIS — Z7901 Long term (current) use of anticoagulants: Secondary | ICD-10-CM

## 2021-08-06 LAB — POCT INR: INR: 2.2 (ref 2.0–3.0)

## 2021-08-06 NOTE — Patient Instructions (Signed)
Description   Continue taking 1 tablet daily except 2 tablets every Monday and Thursday. Recheck INR in 8 weeks.

## 2021-09-10 ENCOUNTER — Other Ambulatory Visit: Payer: Self-pay | Admitting: Cardiovascular Disease

## 2021-09-11 NOTE — Telephone Encounter (Signed)
Request for warfarin refill:  Last INR was 2.2 on 08/06/21 Next INR due on 10/01/21 LOV was 07/17/20  Pt past due for MD appt.  Message sent to schedulers. Refill approved x 1

## 2021-09-19 ENCOUNTER — Other Ambulatory Visit: Payer: Self-pay | Admitting: Cardiovascular Disease

## 2021-09-19 NOTE — Telephone Encounter (Signed)
Refill request for warfarin:  Last INR was 2.2 on 08/06/21 Next INR due on 10/01/21 LOV was 07/17/20  Corky Downs MD  Pt is past due for appt with Dr Claiborne Billings.  Message sent to schedulers to make appt. Refill approved x 1

## 2021-10-01 ENCOUNTER — Ambulatory Visit: Payer: Medicare Other | Attending: Cardiovascular Disease

## 2021-10-01 DIAGNOSIS — Z7901 Long term (current) use of anticoagulants: Secondary | ICD-10-CM

## 2021-10-01 LAB — POCT INR: INR: 2.8 (ref 2.0–3.0)

## 2021-10-01 NOTE — Patient Instructions (Signed)
Continue taking 1 tablet daily except 2 tablets every Monday and Thursday. Recheck INR in 8 weeks. [phone] 

## 2021-10-19 ENCOUNTER — Other Ambulatory Visit: Payer: Self-pay | Admitting: Cardiovascular Disease

## 2021-10-29 ENCOUNTER — Ambulatory Visit: Payer: Medicare Other | Attending: Cardiovascular Disease | Admitting: Cardiovascular Disease

## 2021-10-29 ENCOUNTER — Encounter: Payer: Self-pay | Admitting: Cardiovascular Disease

## 2021-10-29 VITALS — BP 116/70 | HR 56 | Wt 163.8 lb

## 2021-10-29 DIAGNOSIS — I480 Paroxysmal atrial fibrillation: Secondary | ICD-10-CM

## 2021-10-29 DIAGNOSIS — Z86711 Personal history of pulmonary embolism: Secondary | ICD-10-CM

## 2021-10-29 DIAGNOSIS — E785 Hyperlipidemia, unspecified: Secondary | ICD-10-CM

## 2021-10-29 DIAGNOSIS — I2510 Atherosclerotic heart disease of native coronary artery without angina pectoris: Secondary | ICD-10-CM | POA: Diagnosis not present

## 2021-10-29 DIAGNOSIS — Z951 Presence of aortocoronary bypass graft: Secondary | ICD-10-CM

## 2021-10-29 DIAGNOSIS — I10 Essential (primary) hypertension: Secondary | ICD-10-CM

## 2021-10-29 DIAGNOSIS — Z7901 Long term (current) use of anticoagulants: Secondary | ICD-10-CM

## 2021-10-29 DIAGNOSIS — I4510 Unspecified right bundle-branch block: Secondary | ICD-10-CM

## 2021-10-29 NOTE — Patient Instructions (Signed)
Medication Instructions:  Your physician recommends that you continue on your current medications as directed. Please refer to the Current Medication list given to you today.  *If you need a refill on your cardiac medications before your next appointment, please call your pharmacy*  Lab Work: Please return for FASTING labs tomorrow (CMET, CBC, Lipid, TSH)  Our in office lab hours are Monday-Friday 8:00-4:00, closed for lunch 12:45-1:45 pm.  No appointment needed.  [facility] locations:   [place] ([place]) - [address] - [address] [place] - [address] ([facility])  Follow-Up: At [facility], you and your health needs are our priority.  As part of our continuing mission to provide you with exceptional heart care, we have created designated Provider Care Teams.  These Care Teams include your primary Cardiologist (physician) and Advanced Practice Providers (APPs -  Physician Assistants and Nurse Practitioners) who all work together to provide you with the care you need, when you need it.  We recommend signing up for the patient portal called "MyChart".  Sign up information is provided on this After Visit Summary.  MyChart is used to connect with patients for Virtual Visits (Telemedicine).  Patients are able to view lab/test results, encounter notes, upcoming appointments, etc.  Non-urgent messages can be sent to your provider as well.   To learn more about what you can do with MyChart, go to [email or web address].    Your next appointment:   12 month(s)  The format for your next appointment:   In Person  Provider:   Dr. Claiborne Billings

## 2021-10-29 NOTE — Progress Notes (Signed)
Patient ID: Richard Mendez, male   DOB: February 22, 1925, 88 y.o.   MRN: [id number]         HPI: Richard Mendez is a 88 y.o. male who presents to the office for a 16 month cardiology evaluation.  Richard Mendez has known CAD and underwent CABG surgery on 02/06/2006 by Richard Mendez with a LIMA  to the LAD, a vein to the diagonal, vein to the OM 2, and vein to the PDA. His last cardiac catheterization September 2008 showed that all grafts were patent. He did have left main occlusion and an 80% RCA stenosis involving the PLA vessel. He has a history of a right DVT with subsequent PE and has been on Coumadin anticoagulation.  Additional problems include a history of right bundle branch block, hypertension, and hyperlipidemia. He has occasional hip discomfort related to his prior hip replacement surgery in 1997. He admits to ankle swelling, left greater than right.  In 2015  his dose of Toprol was reduced from  25 mg to 12.5 mg daily when he had complaints of mild dizziness and was bradycardic.  His symptoms did improve.  He denies recent episodes of chest pain.  When I last saw him he was having indigestion symptoms.  At that time, he was having Tums frequently.  I initiated therapy with Protonix 40 mg, which has completely resolved his previous symptomatology.  He is on Coumadin anticoagulation.  When I  saw him in 2017, he was having intermittent gas and abdominal bloating recommended the addition of over-the-counter some methadone to his pantoprazole which he had been on chronically.  Over the past year, he denies any episodes of anginal type symptoms.  He admits to occasional swelling of his left ankle.  He has been having difficulty with his right hip and may ultimately require hip surgery in the future.  Recent laboratory prior to his April 2018 office visit:  Cholesterol was 138, triglycerides 166, HDL 33, VLDL 33 and LDL cholesterol 72.  One 10 and his creatinine was 1.19.  He had normal LFTs and a normal CBC.   TSH was normal.    Since he may ultimately require hip surgery  I had recommended that he undergo a six-year follow-up nuclear study, which also could be used for preoperative clearance if necessary.  The nuclear study was done on 08/06/2016 and remained entirely normal with an EF of 62%.  There was suggestion of possible septal hypokinesis post CABG.  There are no ECG changes.  He had normal perfusion.    I saw him in July 2019 Since his prior evaluation in 2018 he continued to be bothered by right hip discomfort. He had remotely undergone hip replacement  over 20 years ago.  He is requested a referral to see orthopedist since the orthopedist had retired and referral was made to Richard Mendez.   He denies any episodes of chest pain.  He denies any palpitations.  He denies presyncope or syncope.  He denies any PND orthopnea.  He has continued to be on anticoagulation with his remote history of DVT with subsequent PE.   I saw him and over the 2 previous years he continued to do fairly well from a cardiac standpoint.   He denied any anginal symptoms.  He continues to be on warfarin.  INR was obtained  and this was 2.8, therapeutic.  He has been taking hydrochlorothiazide daily for swelling.  He was on rosuvastatin 20 mg daily for hyperlipidemia.  He also  has been on metoprolol succinate 12.5 mg.  He had undergone laboratory in July 30 2019 which showed a hemoglobin of 16 hematocrit 46.9.  Total cholesterol was 124 with an LDL cholesterol of 68.  There was mild triglyceride elevation at 155.    I last saw him on July 17, 2020 at which time he remained stable and denied any chest pain or shortness of breath.  He continued to be on metoprolol succinate 12.5 mg, HCTZ 12.5 mg for hypertension.  He is on warfarin 2 mg on Monday and Thursday and 1 mg all other days.  He has been taking aspirin 81 mg in addition to warfarin.  He was on pantoprazole for GERD and finasteride for prostate issues.  He denied significant edema.   During that evaluation with his age of 77 years I recommended he discontinue aspirin therapy due to potential bleed risk with warfarin therapy.  Since I last saw him, he has continued to do well.  He denies chest pain or shortness of breath.  There are no palpitations.  He is on warfarin 2 mg on Monday and Thursday and 1 mg on other days.  He continues to be on hydrochlorothiazide 12.5 mg, metoprolol succinate 12.5 mg for blood pressure control.  He is on rosuvastatin 20 mg for hyperlipidemia.  He has not had recent laboratory.  He presents for evaluation.  Past Medical History:  Diagnosis Date   CAD (coronary artery disease)    CABG   Colon cancer ([place]) 1997   surgery    History of DVT (deep vein thrombosis)    right   History of pulmonary embolism    History of stress test 09/2008   no change from previous, no significant ischemia, and EF of 68%   Hx of echocardiogram 11/07/2010   showed normal systolic function, grade 2 diastolic dysfunction and also had abnormal tissue doppler suggesting increased LA pressure, left atrium was mildly dilated, mild to moderate mitral annular calcification with mild MR, moderate tricuspid regurgitation with moderate pulmonary hypertension with estimated RV systolic pressure of 41; trace AR and trace PR   Hyperlipidemia    Hypertension    RBBB (right bundle branch block)    S/P CABG (coronary artery bypass graft) 01/2006    Past Surgical History:  Procedure Laterality Date   ANKLE SURGERY  1978   ankle fracture   CARDIAC CATHETERIZATION  9/292008   severe native CAD with severe diffuse coronary calcif & total occlusion of L main & diffuse native RCA with 30% prox, 70-80% mid, diffused 50% stenosis before & after the crux, 80% distal RCA stenosis involving PLA; patent vein graft to PDA of RCA, patent vein graft to OM2 of Cfx, patient vein graft supplying diagonal, patent LIMA to mid-distal LAD (Richard Mendez)   CHOLECYSTECTOMY  2003   COLONOSCOPY      CORONARY ARTERY BYPASS GRAFT  02/06/2006   LIMA placed to the LAD, a vein to the diagonal, a vein to the OM2 and a vein to the PDA. ([name]) - diagnostic cath by Richard Mendez on 01/31/2006   HERNIA REPAIR  2006   NM [place]  10/2010   lexiscan; normal patten of perfusion in all regions; post-stress EF 70%, occ PVCs throughout the study, low risk scan    SPLENECTOMY  1944   TRANSTHORACIC ECHOCARDIOGRAM  10/2010   EF=>55%, LA mildly dilated, RA mildly dilated; mild-mod mitral annular calcif, miild MR, mod TR, mod pulm HTN,  RSVP 40-79mmH; trace AV regurg, trace pulm valve regurg    Allergies  Allergen Reactions   Vesicare [Solifenacin]     Altered mental status   Morphine And Related Itching and Rash    Current Outpatient Medications  Medication Sig Dispense Refill   COVID-19 mRNA bivalent vaccine, Pfizer, (PFIZER COVID-19 VAC BIVALENT) injection Inject into the muscle. 0.3 mL 0   finasteride (PROSCAR) 5 MG tablet Take 1 tablet by mouth daily.     hydrochlorothiazide (MICROZIDE) 12.5 MG capsule TAKE 1 CAPSULE(12.5 MG) BY MOUTH DAILY 30 capsule 0   metoprolol succinate (TOPROL-XL) 25 MG 24 hr tablet TAKE 1/2 TABLET BY MOUTH DAILY 30 tablet 0   pantoprazole (PROTONIX) 40 MG tablet TAKE 1 TABLET BY MOUTH EVERY DAY 30 tablet 11   Polyethylene Glycol 3350 (MIRALAX PO) Take by mouth. 1 cup full in the AM     rosuvastatin (CRESTOR) 20 MG tablet TAKE 1 TABLET(20 MG) BY MOUTH DAILY 90 tablet 2   tamsulosin (FLOMAX) 0.4 MG CAPS Take 1 capsule by mouth daily.     warfarin (COUMADIN) 1 MG tablet TAKE 1-2 TABLETS BY MOUTH AS DIRECTED BY COUMADIN CLINIC 180 tablet 0   No current facility-administered medications for this visit.    Socially he is married has 4 children 3 grandchildren 4 great-grandchildren. There is no tobacco or alcohol use. He does not routinely exercise.  ROS General: Negative; No fevers, chills, or night sweats;  HEENT: Negative; No changes in vision or  hearing, sinus congestion, difficulty swallowing Pulmonary: Negative; No cough, wheezing, shortness of breath, hemoptysis Cardiovascular: Negative; No chest pain, presyncope, syncope, palpitations GI:  Positive for indigestion GU: Negative; No dysuria, hematuria, or difficulty voiding Musculoskeletal: Negative; no myalgias, joint pain, or weakness Hematologic/Oncology: Remote history of splenomegaly at age 49. Endocrine: Negative; no heat/cold intolerance; no diabetes Neuro: Negative; no changes in balance, headaches Skin: Lipoma of his left tricep cyst in the back of his neck; No rashes or skin lesions Psychiatric: Negative; No behavioral problems, depression Sleep: Negative; No snoring, daytime sleepiness, hypersomnolence, bruxism, restless legs, hypnogognic hallucinations, no cataplexy Other comprehensive 14 point system review is negative.   PE BP 116/70 (BP Location: Left Arm, Patient Position: Sitting)   Pulse (!) 56   Wt 163 lb 12.8 oz (74.3 kg)   SpO2 94%   BMI 25.65 kg/m    Repeat blood pressure by me was 118/72.  6  Wt Readings from Last 3 Encounters:  10/29/21 163 lb 12.8 oz (74.3 kg)  07/17/20 166 lb (75.3 kg)  08/02/19 172 lb (78 kg)   General: Alert, oriented, no distress.  Skin: normal turgor, no rashes, warm and dry HEENT: Normocephalic, atraumatic. Pupils equal round and reactive to light; sclera anicteric; extraocular muscles intact;  Nose without nasal septal hypertrophy Mouth/Parynx benign; Mallinpatti scale 3 Neck: No JVD, no carotid bruits; normal carotid upstroke Lungs: clear to ausculatation and percussion; no wheezing or rales Chest wall: without tenderness to palpitation Heart: PMI not displaced, minimal irregularity to heart rhythm rate in the upper 50s, s1 s2 normal, 1/6 systolic murmur, no diastolic murmur, no rubs, gallops, thrills, or heaves Abdomen: soft, nontender; no hepatosplenomehaly, BS+; abdominal aorta nontender and not dilated by  palpation. Back: no CVA tenderness Pulses 2+ Musculoskeletal: full range of motion, normal strength, no joint deformities Extremities: no clubbing cyanosis or edema, Homan's sign negative  Neurologic: grossly nonfocal; Cranial nerves grossly wnl Psychologic: Normal mood and affect   October 29, 2021 ECG (independently read by  me): Probable A fib with controlled rate at 56  with possible intermittent sinus rhythm, RBBB, LAD, QTc 478 msec  July 17, 2020 ECG (independently read by me): Sinus rhythm with PACs, PVC, RBBB, QTc 476 msec  August 11, 2019 ECG (independently read by me): Probable sinus rhythm with frequent PACs.  Right bundle branch block with repolarization changes.  Left axis deviation.  QTc interval 463 ms first-degree AV block  August 04, 2017 ECG (independently read by me): Sinus rhythm with sinus arrhythmia.  Heart rate 60 bpm.  First-degree AV block with a PR interval of 248 ms.  Right bundle branch block with repolarization changes.  July 2018 ECG (independently read by me): Normal sinus rhythm at 66 bpm.  First-degree AV block with a PR interval at 274 ms.  Right bundle-branch block with repolarization changes.    April 2018 ECG (independently read by me): Normal sinus rhythm at 64 bpm.  Right bundle-branch block with repolarization changes.  First-degree AV block with a PR interval at 284 ms.  May 2017 ECG (independently read by me): Sinus bradycardia 57 bpm.  First-degree AV block with a PR interval 250 ms.  Right bundle-branch block with repolarization changes.  November 2016 ECG (independently read by me): Sinus bradycardia 56 bpm.  First-degree AV block.  Right bundle-branch block.  April 2016 ECG (independently read by me):  Normal sinus rhythm at 60 bpm.  Right bundle branch block with repolarization changes.  First-degree AV block with a PR interval at 248 ms.  October 2015   ECG (independently read by me): Sinus bradycardia at 57 beats per minute.  Right bundle branch  block with repolarization changes.  First degree AV block with a PR interval 226 ms.  Prior January 2015 ECG( independently interpreted by me): Normal sinus rhythm at 61 beats per minute with right bundle branch block and repolarization changes. Probable left anterior hemiblock. First degree AV block with PR interval at 2:30 milliseconds.  Prior ECG of 08/18/2012: Normal sinus rhythm at 59 beats per minute with first-degree AV block. Right bundle branch block with repolarization changes.  LABS:     Latest Ref Rng & Units 10/30/2021    8:56 AM 07/17/2020   10:47 AM 07/30/2019   10:48 AM  BMP  Glucose 70 - 99 mg/dL 119  124  128   BUN 10 - 36 mg/dL _0 Creatinine 0.76 - 1.27 mg/dL 1.37  1.35  1.44   BUN/Creat Ratio 10 - _1 Sodium 134 - 144 mmol/L 139  140  140   Potassium 3.5 - 5.2 mmol/L 4.6  4.9  4.3   Chloride 96 - 106 mmol/L 103  104  105   CO2 20 - 29 mmol/L _2 Calcium 8.6 - 10.2 mg/dL 9.7  9.7  9.5        Latest Ref Rng & Units 10/30/2021    8:56 AM 07/17/2020   10:47 AM 07/30/2019   10:48 AM  Hepatic Function  Total Protein 6.0 - 8.5 g/dL 7.2  7.2  7.0   Albumin 3.6 - 4.6 g/dL 4.3  4.2    AST 0 - 40 IU/L _3 ALT 0 - 44 IU/L _4 Alk Phosphatase 44 - 121 IU/L 50  50    Total Bilirubin 0.0 - 1.2 mg/dL 1.0  1.3  1.2  Latest Ref Rng & Units 10/30/2021    8:56 AM 07/17/2020   10:47 AM 07/30/2019   10:48 AM  CBC  WBC 3.4 - 10.8 x10E3/uL 8.9  10.0  9.5   Hemoglobin 13.0 - 17.7 g/dL 15.3  15.5  16.0   Hematocrit 37.5 - 51.0 % 43.2  44.3  46.9   Platelets 150 - 450 x10E3/uL 314  314  294    Lab Results  Component Value Date   MCV 98 (H) 10/30/2021   MCV 99 (H) 07/17/2020   MCV 100.0 07/30/2019      BNP    Component Value Date/Time   PROBNP 943.0 (H) 10/19/2006 1617      Lipid Panel     Component Value Date/Time   CHOL 130 10/30/2021 0856   TRIG 144 10/30/2021 0856   HDL 36 (L) 10/30/2021 0856   CHOLHDL  3.6 10/30/2021 0856   CHOLHDL 3.8 07/30/2019 1048   VLDL 33 (H) 04/29/2016 1415   LDLCALC 69 10/30/2021 0856   LDLCALC 68 07/30/2019 1048     RADIOLOGY: No results found.  IMPRESSION:  1. CAD in native artery   2. Hx of CABG   3. Essential hypertension   4. Hyperlipidemia with target LDL less than 70   5. Long term current use of anticoagulant therapy   6. PAF (paroxysmal atrial fibrillation) ([place])   7. RBBB   8. History of pulmonary embolism     ASSESSMENT AND PLAN: Mr. Ander is an 88 year-old male who is status post CABG revascularization surgery in January 2008.  At his last catheterization in September 2008 all grafts were patent.  He has been on chronic Coumadin for his history of PE. He is not having any evidence for abnormal bleeding.  He continues to be without anginal symptomatology.  In July 2018 a nuclear perfusion study continued to be normal and did not reveal any scar or ischemia.  When last seen by me in June 2022 ECG showed sinus rhythm with PACs, PVC and right bundle branch block.  His ECG today shows mild irregularity with suggestion of possible atrial fibrillation with a controlled rate at 56 but there also appears to be intermittent sinus beats.  He is anticoagulated on warfarin.  His aspirin was discontinued when I last saw him.  I have recommended follow-up laboratory in the fasting state with a comprehensive metabolic panel, CBC, TSH and lipid studies.  Adjustments will be made to his medical regimen if necessary.  He continues to see Dr. Kathryne Eriksson for his primary care I will see him in 1 year or sooner as needed.    Troy Sine, MD, [facility]  11/04/2021 11:21 AM

## 2021-10-31 LAB — COMPREHENSIVE METABOLIC PANEL
ALT: 12 IU/L (ref 0–44)
AST: 17 IU/L (ref 0–40)
Albumin/Globulin Ratio: 1.5 (ref 1.2–2.2)
Albumin: 4.3 g/dL (ref 3.6–4.6)
Alkaline Phosphatase: 50 IU/L (ref 44–121)
BUN/Creatinine Ratio: 18 (ref 10–24)
BUN: 24 mg/dL (ref 10–36)
Bilirubin Total: 1 mg/dL (ref 0.0–1.2)
CO2: 23 mmol/L (ref 20–29)
Calcium: 9.7 mg/dL (ref 8.6–10.2)
Chloride: 103 mmol/L (ref 96–106)
Creatinine, Ser: 1.37 mg/dL — ABNORMAL HIGH (ref 0.76–1.27)
Globulin, Total: 2.9 g/dL (ref 1.5–4.5)
Glucose: 119 mg/dL — ABNORMAL HIGH (ref 70–99)
Potassium: 4.6 mmol/L (ref 3.5–5.2)
Sodium: 139 mmol/L (ref 134–144)
Total Protein: 7.2 g/dL (ref 6.0–8.5)
eGFR: 48 mL/min/{1.73_m2} — ABNORMAL LOW (ref >59)

## 2021-10-31 LAB — CBC
Hematocrit: 43.2 % (ref 37.5–51.0)
Hemoglobin: 15.3 g/dL (ref 13.0–17.7)
MCH: 34.5 pg — ABNORMAL HIGH (ref 26.6–33.0)
MCHC: 35.4 g/dL (ref 31.5–35.7)
MCV: 98 fL — ABNORMAL HIGH (ref 79–97)
Platelets: 314 10*3/uL (ref 150–450)
RBC: 4.43 x10E6/uL (ref 4.14–5.80)
RDW: 13.2 % (ref 11.6–15.4)
WBC: 8.9 10*3/uL (ref 3.4–10.8)

## 2021-10-31 LAB — LIPID PANEL
Chol/HDL Ratio: 3.6 ratio (ref 0.0–5.0)
Cholesterol, Total: 130 mg/dL (ref 100–199)
HDL: 36 mg/dL — ABNORMAL LOW (ref >39)
LDL Chol Calc (NIH): 69 mg/dL (ref 0–99)
Triglycerides: 144 mg/dL (ref 0–149)
VLDL Cholesterol Cal: 25 mg/dL (ref 5–40)

## 2021-10-31 LAB — TSH: TSH: 3.83 u[IU]/mL (ref 0.450–4.500)

## 2021-11-04 ENCOUNTER — Encounter: Payer: Self-pay | Admitting: Cardiovascular Disease

## 2021-11-18 ENCOUNTER — Other Ambulatory Visit: Payer: Self-pay | Admitting: Cardiovascular Disease

## 2021-11-22 ENCOUNTER — Other Ambulatory Visit: Payer: Self-pay | Admitting: Cardiovascular Disease

## 2021-11-26 ENCOUNTER — Ambulatory Visit: Payer: Medicare Other | Attending: Cardiovascular Disease

## 2021-11-26 DIAGNOSIS — Z7901 Long term (current) use of anticoagulants: Secondary | ICD-10-CM

## 2021-11-26 LAB — POCT INR: INR: 2.6 (ref 2.0–3.0)

## 2021-11-26 NOTE — Patient Instructions (Signed)
Continue taking 1 tablet daily except 2 tablets every Monday and Thursday. Recheck INR in 8 weeks. [phone]

## 2021-12-06 ENCOUNTER — Other Ambulatory Visit: Payer: Self-pay | Admitting: Cardiovascular Disease

## 2021-12-06 NOTE — Telephone Encounter (Signed)
Refill request for warfarin:  Last INR was 2.6 on 11/26/21 Next INR due 01/25/22 LOV was 10/29/21  Corky Downs MD  Refill approved.

## 2021-12-17 ENCOUNTER — Other Ambulatory Visit (HOSPITAL_BASED_OUTPATIENT_CLINIC_OR_DEPARTMENT_OTHER): Payer: Self-pay

## 2021-12-17 MED ORDER — COMIRNATY 30 MCG/0.3ML IM SUSY
PREFILLED_SYRINGE | INTRAMUSCULAR | 0 refills | Status: AC
  Filled 2021-12-17: qty 0.3, 1d supply, fill #0

## 2022-01-25 ENCOUNTER — Ambulatory Visit: Payer: Medicare Other | Attending: Cardiology | Admitting: *Deleted

## 2022-01-25 DIAGNOSIS — Z7901 Long term (current) use of anticoagulants: Secondary | ICD-10-CM | POA: Diagnosis not present

## 2022-01-25 DIAGNOSIS — I2699 Other pulmonary embolism without acute cor pulmonale: Secondary | ICD-10-CM | POA: Diagnosis not present

## 2022-01-25 LAB — POCT INR: INR: 2.9 (ref 2.0–3.0)

## 2022-01-25 NOTE — Patient Instructions (Signed)
Description   Continue taking 1 tablet daily except 2 tablets every Monday and Thursday. Recheck INR in 8 weeks. [phone]

## 2022-01-27 ENCOUNTER — Other Ambulatory Visit: Payer: Self-pay

## 2022-01-27 ENCOUNTER — Encounter (HOSPITAL_COMMUNITY): Payer: Self-pay

## 2022-01-27 ENCOUNTER — Emergency Department (HOSPITAL_COMMUNITY)
Admission: EM | Admit: 2022-01-27 | Discharge: 2022-01-28 | Disposition: A | Discharge status: Home / Self Care | Payer: Medicare Other | Attending: Emergency Medicine | Admitting: Emergency Medicine

## 2022-01-27 ENCOUNTER — Emergency Department (HOSPITAL_COMMUNITY): Payer: Medicare Other

## 2022-01-27 DIAGNOSIS — Z7901 Long term (current) use of anticoagulants: Secondary | ICD-10-CM | POA: Diagnosis not present

## 2022-01-27 DIAGNOSIS — E876 Hypokalemia: Secondary | ICD-10-CM | POA: Diagnosis not present

## 2022-01-27 DIAGNOSIS — Z20822 Contact with and (suspected) exposure to covid-19: Secondary | ICD-10-CM | POA: Diagnosis not present

## 2022-01-27 DIAGNOSIS — E860 Dehydration: Secondary | ICD-10-CM | POA: Insufficient documentation

## 2022-01-27 DIAGNOSIS — R55 Syncope and collapse: Secondary | ICD-10-CM | POA: Diagnosis present

## 2022-01-27 LAB — COMPREHENSIVE METABOLIC PANEL
ALT: 13 U/L (ref 0–44)
AST: 21 U/L (ref 15–41)
Albumin: 3.3 g/dL — ABNORMAL LOW (ref 3.5–5.0)
Alkaline Phosphatase: 37 U/L — ABNORMAL LOW (ref 38–126)
Anion gap: 8 (ref 5–15)
BUN: 29 mg/dL — ABNORMAL HIGH (ref 8–23)
CO2: 21 mmol/L — ABNORMAL LOW (ref 22–32)
Calcium: 8.8 mg/dL — ABNORMAL LOW (ref 8.9–10.3)
Chloride: 108 mmol/L (ref 98–111)
Creatinine, Ser: 1.69 mg/dL — ABNORMAL HIGH (ref 0.61–1.24)
GFR, Estimated: 37 mL/min — ABNORMAL LOW (ref >60)
Glucose, Bld: 142 mg/dL — ABNORMAL HIGH (ref 70–99)
Potassium: 3.4 mmol/L — ABNORMAL LOW (ref 3.5–5.1)
Sodium: 137 mmol/L (ref 135–145)
Total Bilirubin: 1.4 mg/dL — ABNORMAL HIGH (ref 0.3–1.2)
Total Protein: 6.9 g/dL (ref 6.5–8.1)

## 2022-01-27 LAB — PROTIME-INR
INR: 2.9 — ABNORMAL HIGH (ref 0.8–1.2)
Prothrombin Time: 29.8 seconds — ABNORMAL HIGH (ref 11.4–15.2)

## 2022-01-27 LAB — CBC
HCT: 42.3 % (ref 39.0–52.0)
Hemoglobin: 14.1 g/dL (ref 13.0–17.0)
MCH: 33.7 pg (ref 26.0–34.0)
MCHC: 33.3 g/dL (ref 30.0–36.0)
MCV: 101 fL — ABNORMAL HIGH (ref 80.0–100.0)
Platelets: 291 10*3/uL (ref 150–400)
RBC: 4.19 MIL/uL — ABNORMAL LOW (ref 4.22–5.81)
RDW: 15.3 % (ref 11.5–15.5)
WBC: 13.1 10*3/uL — ABNORMAL HIGH (ref 4.0–10.5)
nRBC: 0 % (ref 0.0–0.2)

## 2022-01-27 LAB — RESP PANEL BY RT-PCR (RSV, FLU A&B, COVID)  RVPGX2
Influenza A by PCR: NEGATIVE
Influenza B by PCR: NEGATIVE
Resp Syncytial Virus by PCR: NEGATIVE
SARS Coronavirus 2 by RT PCR: NEGATIVE

## 2022-01-27 MED ORDER — LACTATED RINGERS IV BOLUS
1000.0000 mL | Freq: Once | INTRAVENOUS | Status: AC
  Administered 2022-01-27: 1000 mL via INTRAVENOUS

## 2022-01-27 MED ORDER — POTASSIUM CHLORIDE CRYS ER 20 MEQ PO TBCR
40.0000 meq | EXTENDED_RELEASE_TABLET | Freq: Once | ORAL | Status: AC
  Administered 2022-01-27: 40 meq via ORAL
  Filled 2022-01-27: qty 2
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
## 2022-01-27 NOTE — Discharge Instructions (Addendum)
It was our pleasure to provide your ER care today - we hope that you feel better.  Drink plenty of fluids/stay well hydrated. Fall precautions.   Your ct scan was read as showing:1. No evidence of acute intracranial abnormality. 2. Progressive chronic small vessel ischemic disease and cerebral atrophy. 3. Partially visualized small right parotid mass.  As relates the parotid mass, discuss with your primary care doctor at follow up with them.   Your potassium level is mildly low - eat plenty of fruits and vegetables, and follow up with your doctor in 1-2 weeks.   Return to ER if worse, new symptoms, fevers, chest pain, trouble breathing, fainting, or other concern.

## 2022-01-27 NOTE — ED Provider Notes (Signed)
[facility] DEPT Provider Note   CSN: [id number] Arrival date & time: 01/27/22  1814     History  Chief Complaint  Patient presents with   Loss of Consciousness    Richard Mendez is a 89 y.o. male.  Pt presents with general weakness and near syncope vs syncopal episode at home. Per report, pt walking from bathroom, looked as if was going to faint, family assisted to ground. When first responders arrived, they tried to stand patient and patient again got faint, and had to be laid back down. Pt denies fainting and reports he feels fine. Family reported to EMS gradually increasing episodes of confusion for several months without acute change today. Pt on coumadin. Pt denies headache. No chest pain or discomfort. No sob. Denies abd pain or nvd. No dysuria or gu c/o. No pain or injury. Denies any recent blood loss, rectal bleeding or melena. Denies new med/change in meds.   The history is provided by the patient, medical records and the EMS personnel. The history is limited by the condition of the patient.  Loss of Consciousness Associated symptoms: confusion   Associated symptoms: no chest pain, no fever, no headaches, no palpitations, no shortness of breath and no vomiting        Home Medications Prior to Admission medications   Medication Sig Start Date End Date Taking? Authorizing Provider  COVID-19 mRNA bivalent vaccine, Pfizer, (PFIZER COVID-19 VAC BIVALENT) injection Inject into the muscle. 11/07/20   Carlyle Basques, MD  COVID-19 mRNA vaccine [phone] (COMIRNATY) syringe Inject into the muscle. 12/17/21   Carlyle Basques, MD  finasteride (PROSCAR) 5 MG tablet Take 1 tablet by mouth daily. 07/20/12   [provider]  hydrochlorothiazide (MICROZIDE) 12.5 MG capsule TAKE 1 CAPSULE(12.5 MG) BY MOUTH DAILY 11/23/21   Troy Sine, MD  metoprolol succinate (TOPROL-XL) 25 MG 24 hr tablet TAKE 1/2 TABLET BY MOUTH DAILY 11/19/21   Troy Sine, MD   pantoprazole (PROTONIX) 40 MG tablet TAKE 1 TABLET BY MOUTH EVERY DAY 09/04/17   Troy Sine, MD  Polyethylene Glycol 3350 (MIRALAX PO) Take by mouth. 1 cup full in the AM    [provider]  rosuvastatin (CRESTOR) 20 MG tablet TAKE 1 TABLET(20 MG) BY MOUTH DAILY 12/06/21   Troy Sine, MD  tamsulosin (FLOMAX) 0.4 MG CAPS Take 1 capsule by mouth daily. 08/04/12   [provider]  warfarin (COUMADIN) 1 MG tablet TAKE 1-2 TABLETS BY MOUTH AS DIRECTED BY COUMADIN CLINIC 12/06/21   Troy Sine, MD      Allergies    Vesicare [solifenacin], Morphine, and Morphine and related    Review of Systems   Review of Systems  Constitutional:  Negative for fever.  HENT:  Negative for sore throat.   Eyes:  Negative for redness.  Respiratory:  Negative for cough and shortness of breath.   Cardiovascular:  Positive for syncope. Negative for chest pain, palpitations and leg swelling.  Gastrointestinal:  Negative for abdominal pain, blood in stool, diarrhea and vomiting.  Genitourinary:  Negative for dysuria and flank pain.  Musculoskeletal:  Negative for back pain and neck pain.  Skin:  Negative for rash.  Neurological:  Negative for headaches.  Psychiatric/Behavioral:  Positive for confusion.     Physical Exam Updated Vital Signs BP 109/69   Pulse 69   Temp 98.5 F (36.9 C)   Resp 20   Ht 1.702 m ('5\' 7"'$ )   Wt 74 kg  SpO2 95%   BMI 25.55 kg/m  Physical Exam Vitals and nursing note reviewed.  Constitutional:      Appearance: Normal appearance. He is well-developed.  HENT:     Head: Atraumatic.     Nose: Nose normal.     Mouth/Throat:     Mouth: Mucous membranes are moist.     Pharynx: Oropharynx is clear.  Eyes:     General: No scleral icterus.    Conjunctiva/sclera: Conjunctivae normal.     Pupils: Pupils are equal, round, and reactive to light.  Neck:     Vascular: No carotid bruit.     Trachea: No tracheal deviation.  Cardiovascular:     Rate and  Rhythm: Normal rate and regular rhythm.     Pulses: Normal pulses.     Heart sounds: Normal heart sounds. No murmur heard.    No friction rub. No gallop.  Pulmonary:     Effort: Pulmonary effort is normal. No accessory muscle usage or respiratory distress.     Breath sounds: Normal breath sounds.  Chest:     Chest wall: No tenderness.  Abdominal:     General: Bowel sounds are normal. There is no distension.     Palpations: Abdomen is soft. There is no mass.     Tenderness: There is no abdominal tenderness. There is no guarding.  Genitourinary:    Comments: No cva tenderness. Musculoskeletal:        General: No swelling or tenderness.     Cervical back: Normal range of motion and neck supple. No rigidity or tenderness.     Right lower leg: No edema.     Left lower leg: No edema.     Comments: CTLS spine, non tender, aligned, no step off. Good rom bil extremities without pain or focal bony tenderness.   Skin:    General: Skin is warm and dry.     Findings: No rash.  Neurological:     Mental Status: He is alert.     Comments: Alert, speech clear. Motor/sens grossly intact bil. Stre 5/5.   Psychiatric:        Mood and Affect: Mood normal.     ED Results / Procedures / Treatments   Labs (all labs ordered are listed, but only abnormal results are displayed) Results for orders placed or performed during the hospital encounter of 01/27/22  Resp panel by RT-PCR (RSV, Flu A&B, Covid) Anterior Nasal Swab   Specimen: Anterior Nasal Swab  Result Value Ref Range   SARS Coronavirus 2 by RT PCR NEGATIVE NEGATIVE   Influenza A by PCR NEGATIVE NEGATIVE   Influenza B by PCR NEGATIVE NEGATIVE   Resp Syncytial Virus by PCR NEGATIVE NEGATIVE  Comprehensive metabolic panel  Result Value Ref Range   Sodium 137 135 - 145 mmol/L   Potassium 3.4 (L) 3.5 - 5.1 mmol/L   Chloride 108 98 - 111 mmol/L   CO2 21 (L) 22 - 32 mmol/L   Glucose, Bld 142 (H) 70 - 99 mg/dL   BUN 29 (H) 8 - 23 mg/dL    Creatinine, Ser 1.69 (H) 0.61 - 1.24 mg/dL   Calcium 8.8 (L) 8.9 - 10.3 mg/dL   Total Protein 6.9 6.5 - 8.1 g/dL   Albumin 3.3 (L) 3.5 - 5.0 g/dL   AST 21 15 - 41 U/L   ALT 13 0 - 44 U/L   Alkaline Phosphatase 37 (L) 38 - 126 U/L   Total Bilirubin 1.4 (H) 0.3 - 1.2  mg/dL   GFR, Estimated 37 (L) >60 mL/min   Anion gap 8 5 - 15  CBC  Result Value Ref Range   WBC 13.1 (H) 4.0 - 10.5 K/uL   RBC 4.19 (L) 4.22 - 5.81 MIL/uL   Hemoglobin 14.1 13.0 - 17.0 g/dL   HCT 42.3 39.0 - 52.0 %   MCV 101.0 (H) 80.0 - 100.0 fL   MCH 33.7 26.0 - 34.0 pg   MCHC 33.3 30.0 - 36.0 g/dL   RDW 15.3 11.5 - 15.5 %   Platelets 291 150 - 400 K/uL   nRBC 0.0 0.0 - 0.2 %  Protime-INR  Result Value Ref Range   Prothrombin Time 29.8 (H) 11.4 - 15.2 seconds   INR 2.9 (H) 0.8 - 1.2    EKG EKG Interpretation  Date/Time:  Sunday January 27 2022 19:03:26 EST Ventricular Rate:  73 PR Interval:  292 QRS Duration: 159 QT Interval:  436 QTC Calculation: 481 R Axis:   -79 Text Interpretation: Sinus rhythm Prolonged PR interval RBBB and LAFB Non-specific ST-t changes Confirmed by Lajean Saver [phone]) on 01/27/2022 7:20:49 PM  Radiology CT Head Wo Contrast  Result Date: 01/27/2022 CLINICAL DATA:  Mental status change, unknown cause. EXAM: CT HEAD WITHOUT CONTRAST TECHNIQUE: Contiguous axial images were obtained from the base of the skull through the vertex without intravenous contrast. RADIATION DOSE REDUCTION: This exam was performed according to the departmental dose-optimization program which includes automated exposure control, adjustment of the mA and/or kV according to patient size and/or use of iterative reconstruction technique. COMPARISON:  Head CT 02/11/2017 FINDINGS: Brain: Motion artifact limits assessment near the skull vertex. Within this limitation, no acute infarct, intracranial hemorrhage, mass, midline shift, or extra-axial fluid collection is identified. Patchy to confluent hypodensities in the  cerebral white matter bilaterally have progressed and are nonspecific but compatible with extensive chronic small vessel ischemic disease. There is progressive, moderate cerebral atrophy. Vascular: Calcified atherosclerosis at the skull base. No hyperdense vessel. Skull: No acute fracture or suspicious osseous lesion. Sinuses/Orbits: Visualized paranasal sinuses and mastoid air cells are clear. Bilateral cataract extraction. Other: Partially visualized new mass in the superior, superficial right parotid gland measuring at least 1.3 cm in size. IMPRESSION: 1. No evidence of acute intracranial abnormality. 2. Progressive chronic small vessel ischemic disease and cerebral atrophy. 3. Partially visualized small right parotid mass, new from 2019. If further evaluation is clinically warranted taking into account the patient's age and comorbidities, contrast-enhanced neck CT and ENT referral could be considered. Electronically Signed   By: Logan Bores M.D.   On: 01/27/2022 19:31    Procedures Procedures    Medications Ordered in ED Medications  lactated ringers bolus 1,000 mL (1,000 mLs Intravenous New Bag/Given 01/27/22 1952)  potassium chloride SA (KLOR-CON M) CR tablet 40 mEq (40 mEq Oral Given 01/27/22 2055)  lactated ringers bolus 1,000 mL (1,000 mLs Intravenous New Bag/Given 01/27/22 2058)    ED Course/ Medical Decision Making/ A&P                           Medical Decision Making Problems Addressed: Dehydration: acute illness or injury with systemic symptoms that poses a threat to life or bodily functions Hypokalemia: acute illness or injury Near syncope: acute illness or injury with systemic symptoms Syncope and collapse: acute illness or injury with systemic symptoms that poses a threat to life or bodily functions  Amount and/or Complexity of Data Reviewed Independent Historian: EMS  Details: hx External Data Reviewed: notes. Labs: ordered. Decision-making details documented in ED  Course. Radiology: ordered and independent interpretation performed. Decision-making details documented in ED Course. ECG/medicine tests: ordered and independent interpretation performed. Decision-making details documented in ED Course.  Risk Prescription drug management. Decision regarding hospitalization.   Iv ns. Continuous pulse ox and cardiac monitoring. Labs ordered/sent. Imaging ordered.   Differential diagnosis includes  syncope, near syncope, dehydration, symptomatic anemia, etc. Dispo decision including potential need for admission considered - will get labs and imaging and reassess.   Reviewed nursing notes and prior charts for additional history. External reports reviewed. Additional history from: EMS.   Cardiac monitor: sinus rhythm, rate 70.  Labs reviewed/interpreted by me - k sl low. Cr mildly elev, ?possible dehydration. Ivf bolus. Kcl po. Po fluids/food.   CT reviewed/interpreted by me - no hem/acute process. Small parotid mass, discussed w pt and will put in AVS to f/u pcp.   Recheck, no current c/o. No distress. Await UA.  2310, UA, ivf and po trial/ambulate trial pending - pt signed out to Dr Florina Ou to check UA, recheck pt, and dispo appropriately.          Final Clinical Impression(s) / ED Diagnoses Final diagnoses:  Near syncope  Syncope and collapse  Dehydration  Hypokalemia    Rx / DC Orders ED Discharge Orders     None         Lajean Saver, MD 01/27/22 2311

## 2022-01-27 NOTE — ED Notes (Signed)
Pt. Refuses In and out catheterization stating, "You will not touch me. If you do, I'll hit you."

## 2022-01-27 NOTE — ED Notes (Signed)
Pt refuse vitals  

## 2022-01-28 LAB — URINALYSIS, ROUTINE W REFLEX MICROSCOPIC
Bacteria, UA: NONE SEEN
Bilirubin Urine: NEGATIVE
Glucose, UA: NEGATIVE mg/dL
Ketones, ur: NEGATIVE mg/dL
Nitrite: NEGATIVE
Protein, ur: NEGATIVE mg/dL
Specific Gravity, Urine: 1.015 (ref 1.005–1.030)
pH: 5 (ref 5.0–8.0)

## 2022-01-28 NOTE — ED Provider Notes (Signed)
Nursing notes and vitals signs, including pulse oximetry, reviewed.  Summary of this visit's results, reviewed by myself:  EKG:  EKG Interpretation  Date/Time:  Sunday January 27 2022 19:03:26 EST Ventricular Rate:  73 PR Interval:  292 QRS Duration: 159 QT Interval:  436 QTC Calculation: 481 R Axis:   -79 Text Interpretation: Sinus rhythm Prolonged PR interval RBBB and LAFB Non-specific ST-t changes Confirmed by Lajean Saver [phone]) on 01/27/2022 7:20:49 PM        Labs:  Results for orders placed or performed during the hospital encounter of 01/27/22 (from the past 24 hour(s))  Comprehensive metabolic panel     Status: Abnormal   Collection Time: 01/27/22  6:58 PM  Result Value Ref Range   Sodium 137 135 - 145 mmol/L   Potassium 3.4 (L) 3.5 - 5.1 mmol/L   Chloride 108 98 - 111 mmol/L   CO2 21 (L) 22 - 32 mmol/L   Glucose, Bld 142 (H) 70 - 99 mg/dL   BUN 29 (H) 8 - 23 mg/dL   Creatinine, Ser 1.69 (H) 0.61 - 1.24 mg/dL   Calcium 8.8 (L) 8.9 - 10.3 mg/dL   Total Protein 6.9 6.5 - 8.1 g/dL   Albumin 3.3 (L) 3.5 - 5.0 g/dL   AST 21 15 - 41 U/L   ALT 13 0 - 44 U/L   Alkaline Phosphatase 37 (L) 38 - 126 U/L   Total Bilirubin 1.4 (H) 0.3 - 1.2 mg/dL   GFR, Estimated 37 (L) >60 mL/min   Anion gap 8 5 - 15  CBC     Status: Abnormal   Collection Time: 01/27/22  6:58 PM  Result Value Ref Range   WBC 13.1 (H) 4.0 - 10.5 K/uL   RBC 4.19 (L) 4.22 - 5.81 MIL/uL   Hemoglobin 14.1 13.0 - 17.0 g/dL   HCT 42.3 39.0 - 52.0 %   MCV 101.0 (H) 80.0 - 100.0 fL   MCH 33.7 26.0 - 34.0 pg   MCHC 33.3 30.0 - 36.0 g/dL   RDW 15.3 11.5 - 15.5 %   Platelets 291 150 - 400 K/uL   nRBC 0.0 0.0 - 0.2 %  Protime-INR     Status: Abnormal   Collection Time: 01/27/22  6:58 PM  Result Value Ref Range   Prothrombin Time 29.8 (H) 11.4 - 15.2 seconds   INR 2.9 (H) 0.8 - 1.2  Resp panel by RT-PCR (RSV, Flu A&B, Covid) Anterior Nasal Swab     Status: None   Collection Time: 01/27/22  7:09 PM    Specimen: Anterior Nasal Swab  Result Value Ref Range   SARS Coronavirus 2 by RT PCR NEGATIVE NEGATIVE   Influenza A by PCR NEGATIVE NEGATIVE   Influenza B by PCR NEGATIVE NEGATIVE   Resp Syncytial Virus by PCR NEGATIVE NEGATIVE  Urinalysis, Routine w reflex microscopic Urine, Clean Catch     Status: Abnormal   Collection Time: 01/28/22  1:20 AM  Result Value Ref Range   Color, Urine YELLOW YELLOW   APPearance CLEAR CLEAR   Specific Gravity, Urine 1.015 1.005 - 1.030   pH 5.0 5.0 - 8.0   Glucose, UA NEGATIVE NEGATIVE mg/dL   Hgb urine dipstick SMALL (A) NEGATIVE   Bilirubin Urine NEGATIVE NEGATIVE   Ketones, ur NEGATIVE NEGATIVE mg/dL   Protein, ur NEGATIVE NEGATIVE mg/dL   Nitrite NEGATIVE NEGATIVE   Leukocytes,Ua SMALL (A) NEGATIVE   RBC / HPF 0-5 0 - 5 RBC/hpf   WBC, UA 6-10  0 - 5 WBC/hpf   Bacteria, UA NONE SEEN NONE SEEN   Squamous Epithelial / HPF 0-5 0 - 5 /HPF   Mucus PRESENT     Imaging Studies: CT Head Wo Contrast  Result Date: 01/27/2022 CLINICAL DATA:  Mental status change, unknown cause. EXAM: CT HEAD WITHOUT CONTRAST TECHNIQUE: Contiguous axial images were obtained from the base of the skull through the vertex without intravenous contrast. RADIATION DOSE REDUCTION: This exam was performed according to the departmental dose-optimization program which includes automated exposure control, adjustment of the mA and/or kV according to patient size and/or use of iterative reconstruction technique. COMPARISON:  Head CT 02/11/2017 FINDINGS: Brain: Motion artifact limits assessment near the skull vertex. Within this limitation, no acute infarct, intracranial hemorrhage, mass, midline shift, or extra-axial fluid collection is identified. Patchy to confluent hypodensities in the cerebral white matter bilaterally have progressed and are nonspecific but compatible with extensive chronic small vessel ischemic disease. There is progressive, moderate cerebral atrophy. Vascular: Calcified  atherosclerosis at the skull base. No hyperdense vessel. Skull: No acute fracture or suspicious osseous lesion. Sinuses/Orbits: Visualized paranasal sinuses and mastoid air cells are clear. Bilateral cataract extraction. Other: Partially visualized new mass in the superior, superficial right parotid gland measuring at least 1.3 cm in size. IMPRESSION: 1. No evidence of acute intracranial abnormality. 2. Progressive chronic small vessel ischemic disease and cerebral atrophy. 3. Partially visualized small right parotid mass, new from 2019. If further evaluation is clinically warranted taking into account the patient's age and comorbidities, contrast-enhanced neck CT and ENT referral could be considered. Electronically Signed   By: Logan Bores M.D.   On: 01/27/2022 19:31    Urinalysis not consistent with urinary tract infection.   Toyia Jelinek, Jenny Reichmann, MD 01/28/22 [phone]

## 2022-03-20 ENCOUNTER — Encounter: Payer: Self-pay | Admitting: Pharmacist Clinician (PhC)/ Clinical Pharmacy Specialist

## 2022-03-22 DEATH — deceased
